# Patient Record
Sex: Female | Born: 1945 | Hispanic: No | Marital: Single | Smoking: Never smoker
Health system: Southern US, Community
[De-identification: ages and names within clinical notes are randomized; demographics above are authoritative.]

## PROBLEM LIST (undated history)

## (undated) DIAGNOSIS — F32A Depression, unspecified: Secondary | ICD-10-CM

## (undated) DIAGNOSIS — M359 Systemic involvement of connective tissue, unspecified: Secondary | ICD-10-CM

## (undated) DIAGNOSIS — I10 Essential (primary) hypertension: Secondary | ICD-10-CM

## (undated) DIAGNOSIS — G250 Essential tremor: Secondary | ICD-10-CM

## (undated) DIAGNOSIS — K219 Gastro-esophageal reflux disease without esophagitis: Secondary | ICD-10-CM

## (undated) DIAGNOSIS — T7840XA Allergy, unspecified, initial encounter: Secondary | ICD-10-CM

## (undated) DIAGNOSIS — F329 Major depressive disorder, single episode, unspecified: Secondary | ICD-10-CM

## (undated) HISTORY — PX: CHOLECYSTECTOMY: SHX55

## (undated) HISTORY — DX: Essential (primary) hypertension: I10

## (undated) HISTORY — DX: Depression, unspecified: F32.A

## (undated) HISTORY — DX: Allergy, unspecified, initial encounter: T78.40XA

## (undated) HISTORY — DX: Essential tremor: G25.0

## (undated) HISTORY — DX: Gastro-esophageal reflux disease without esophagitis: K21.9

## (undated) HISTORY — PX: COSMETIC SURGERY: SHX468

---
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
## 2001-07-28 ENCOUNTER — Other Ambulatory Visit: Admission: RE | Admit: 2001-07-28 | Discharge: 2001-07-28 | Payer: Self-pay | Admitting: Dermatology

## 2004-10-19 ENCOUNTER — Ambulatory Visit: Payer: Self-pay | Admitting: Family Medicine

## 2004-12-01 ENCOUNTER — Ambulatory Visit: Payer: Self-pay | Admitting: Family Medicine

## 2004-12-20 ENCOUNTER — Ambulatory Visit: Payer: Self-pay | Admitting: Family Medicine

## 2005-01-22 ENCOUNTER — Ambulatory Visit: Payer: Self-pay | Admitting: Family Medicine

## 2005-02-28 ENCOUNTER — Ambulatory Visit: Payer: Self-pay | Admitting: Family Medicine

## 2005-04-23 ENCOUNTER — Ambulatory Visit: Payer: Self-pay | Admitting: Family Medicine

## 2005-07-27 ENCOUNTER — Ambulatory Visit: Payer: Self-pay | Admitting: Family Medicine

## 2005-08-15 ENCOUNTER — Ambulatory Visit: Payer: Self-pay | Admitting: Family Medicine

## 2005-09-28 ENCOUNTER — Ambulatory Visit: Payer: Self-pay | Admitting: Family Medicine

## 2005-11-27 ENCOUNTER — Ambulatory Visit: Payer: Self-pay | Admitting: Family Medicine

## 2006-03-28 ENCOUNTER — Ambulatory Visit: Payer: Self-pay | Admitting: Family Medicine

## 2006-05-16 ENCOUNTER — Ambulatory Visit: Payer: Self-pay | Admitting: Family Medicine

## 2006-06-11 ENCOUNTER — Ambulatory Visit: Payer: Self-pay | Admitting: Family Medicine

## 2006-07-29 ENCOUNTER — Ambulatory Visit: Payer: Self-pay | Admitting: Family Medicine

## 2006-08-05 ENCOUNTER — Ambulatory Visit: Payer: Self-pay | Admitting: Family Medicine

## 2006-10-09 ENCOUNTER — Ambulatory Visit: Payer: Self-pay | Admitting: Family Medicine

## 2007-01-31 ENCOUNTER — Ambulatory Visit: Payer: Self-pay | Admitting: Family Medicine

## 2007-03-24 ENCOUNTER — Ambulatory Visit: Payer: Self-pay | Admitting: Family Medicine

## 2014-06-08 DIAGNOSIS — T7840XA Allergy, unspecified, initial encounter: Secondary | ICD-10-CM | POA: Insufficient documentation

## 2014-06-08 DIAGNOSIS — F411 Generalized anxiety disorder: Secondary | ICD-10-CM | POA: Insufficient documentation

## 2018-05-10 DEATH — deceased

## 2018-08-18 DIAGNOSIS — M1990 Unspecified osteoarthritis, unspecified site: Secondary | ICD-10-CM | POA: Insufficient documentation

## 2018-08-18 DIAGNOSIS — F3342 Major depressive disorder, recurrent, in full remission: Secondary | ICD-10-CM | POA: Insufficient documentation

## 2019-09-07 ENCOUNTER — Other Ambulatory Visit: Payer: Self-pay

## 2019-09-07 ENCOUNTER — Encounter: Payer: Self-pay | Admitting: Family Medicine

## 2019-09-07 ENCOUNTER — Ambulatory Visit (INDEPENDENT_AMBULATORY_CARE_PROVIDER_SITE_OTHER): Payer: Medicare Other | Admitting: Family Medicine

## 2019-09-07 VITALS — BP 118/66 | HR 88 | Temp 98.6°F | Resp 18 | Ht 61.0 in | Wt 148.0 lb

## 2019-09-07 DIAGNOSIS — K2100 Gastro-esophageal reflux disease with esophagitis, without bleeding: Secondary | ICD-10-CM

## 2019-09-07 DIAGNOSIS — F331 Major depressive disorder, recurrent, moderate: Secondary | ICD-10-CM

## 2019-09-07 DIAGNOSIS — I10 Essential (primary) hypertension: Secondary | ICD-10-CM

## 2019-09-07 DIAGNOSIS — Z23 Encounter for immunization: Secondary | ICD-10-CM | POA: Diagnosis not present

## 2019-09-07 MED ORDER — LOSARTAN POTASSIUM-HCTZ 100-25 MG PO TABS: 1.0000 | ORAL_TABLET | Freq: Every day | ORAL | 1 refills | Status: DC

## 2019-09-07 MED ORDER — VENLAFAXINE HCL ER 75 MG PO CP24: 75.0000 mg | ORAL_CAPSULE | Freq: Every day | ORAL | 1 refills | Status: DC

## 2019-09-07 MED ORDER — FLUTICASONE PROPIONATE 50 MCG/ACT NA SUSP: 1.0000 | Freq: Every day | NASAL | 3 refills | Status: DC

## 2019-09-07 MED ORDER — CETIRIZINE HCL 10 MG PO TABS: 10.0000 mg | ORAL_TABLET | Freq: Every day | ORAL | 3 refills | Status: DC

## 2019-09-07 MED ORDER — MELOXICAM 7.5 MG PO TABS: 7.5000 mg | ORAL_TABLET | Freq: Two times a day (BID) | ORAL | 1 refills | Status: DC

## 2019-09-07 MED ORDER — MONTELUKAST SODIUM 10 MG PO TABS: 10.0000 mg | ORAL_TABLET | Freq: Every day | ORAL | 1 refills | Status: DC

## 2019-09-07 MED ORDER — PANTOPRAZOLE SODIUM 40 MG PO TBEC: 40.0000 mg | DELAYED_RELEASE_TABLET | Freq: Every day | ORAL | 11 refills | Status: DC

## 2019-09-07 MED ORDER — LORAZEPAM 0.5 MG PO TABS: 0.5000 mg | ORAL_TABLET | Freq: Every day | ORAL | 1 refills | Status: DC

## 2019-09-07 NOTE — Progress Notes (Signed)
Subjective:  Patient ID: Lauren Stokes, female    DOB: 05/01/1946  Age: 73 y.o. MRN: [id number]  CC: Establish Care (NEW)   HPI Lauren Stokes presents for new patient appointment.  She is to be followed due to the retirement of Dr. Edrick Oh.  She has been followed there for hypertension, depression and gastroesophageal reflux.   Follow-up of hypertension. Patient has no history of headache chest pain or shortness of breath or recent cough. Patient also denies symptoms of TIA such as numbness weakness lateralizing. Patient checks  blood pressure at home and has not had any elevated readings recently. Patient denies side effects from his medication. States taking it regularly. Patient in for follow-up of GERD. Currently asymptomatic taking  PPI daily. There is no chest pain or heartburn. No hematemesis and no melena. No dysphagia or choking. Onset is remote. Progression is stable. Complicating factors, none.   Depression screen PHQ 2/9 09/07/2019  Decreased Interest 0  Down, Depressed, Hopeless 0  PHQ - 2 Score 0    History Lauren Stokes has a past medical history of Allergy, Depression, Essential tremor, GERD (gastroesophageal reflux disease), and Hypertension.   She has a past surgical history that includes Cosmetic surgery and Cholecystectomy.   Her family history includes Aneurysm in her mother; Anxiety disorder in her mother; Cancer in her father; Hearing loss in her sister; Pneumonia in her mother; Ulcers in her mother.She reports that she has never smoked. She has never used smokeless tobacco. She reports that she does not drink alcohol or use drugs.    ROS Review of Systems  Constitutional: Negative.   HENT: Negative for congestion.   Eyes: Negative for visual disturbance.  Respiratory: Negative for shortness of breath.   Cardiovascular: Negative for chest pain.  Gastrointestinal: Negative for abdominal pain, constipation, diarrhea, nausea and vomiting.  Genitourinary: Negative  for difficulty urinating.  Musculoskeletal: Negative for arthralgias and myalgias.  Neurological: Negative for headaches.  Psychiatric/Behavioral: Negative for sleep disturbance.    Objective:  BP 118/66   Pulse 88   Temp 98.6 F (37 C)   Resp 18   Ht _0  (1.549 m)   Wt 148 lb (67.1 kg)   SpO2 98%   BMI 27.96 kg/m   BP Readings from Last 3 Encounters:  09/07/19 118/66    Wt Readings from Last 3 Encounters:  09/07/19 148 lb (67.1 kg)     Physical Exam Constitutional:      General: She is not in acute distress.    Appearance: She is well-developed.  HENT:     Head: Normocephalic and atraumatic.     Right Ear: External ear normal.     Left Ear: External ear normal.     Nose: Nose normal.  Eyes:     Conjunctiva/sclera: Conjunctivae normal.     Pupils: Pupils are equal, round, and reactive to light.  Neck:     Musculoskeletal: Normal range of motion and neck supple.     Thyroid: No thyromegaly.  Cardiovascular:     Rate and Rhythm: Normal rate and regular rhythm.     Heart sounds: Normal heart sounds. No murmur.  Pulmonary:     Effort: Pulmonary effort is normal. No respiratory distress.     Breath sounds: Normal breath sounds. No wheezing or rales.  Abdominal:     General: Bowel sounds are normal. There is no distension.     Palpations: Abdomen is soft.     Tenderness: There is no abdominal tenderness.  Lymphadenopathy:     Cervical: No cervical adenopathy.  Skin:    General: Skin is warm and dry.  Neurological:     Mental Status: She is alert and oriented to person, place, and time.     Deep Tendon Reflexes: Reflexes are normal and symmetric.  Psychiatric:        Behavior: Behavior normal.        Thought Content: Thought content normal.        Judgment: Judgment normal.       Assessment & Plan:   Lili was seen today for establish care.  Diagnoses and all orders for this visit:  Essential hypertension -     CBC with Differential/Platelet -      CMP14+EGFR -     Lipid panel -     Urinalysis  Need for immunization against influenza -     Flu Vaccine QUAD High Dose(Fluad)  Moderate episode of recurrent major depressive disorder (HCC)  GERD with esophagitis  Other orders -     cetirizine (ZYRTEC) 10 MG tablet; Take 1 tablet (10 mg total) by mouth daily. -     fluticasone (FLONASE) 50 MCG/ACT nasal spray; Place 1 spray into both nostrils daily. -     LORazepam (ATIVAN) 0.5 MG tablet; Take 1 tablet (0.5 mg total) by mouth at bedtime. -     losartan-hydrochlorothiazide (HYZAAR) 100-25 MG tablet; Take 1 tablet by mouth daily. -     meloxicam (MOBIC) 7.5 MG tablet; Take 1 tablet (7.5 mg total) by mouth 2 (two) times daily. -     montelukast (SINGULAIR) 10 MG tablet; Take 1 tablet (10 mg total) by mouth daily. -     venlafaxine XR (EFFEXOR-XR) 75 MG 24 hr capsule; Take 1 capsule (75 mg total) by mouth daily. -     pantoprazole (PROTONIX) 40 MG tablet; Take 1 tablet (40 mg total) by mouth daily. For stomach       I have changed Zilpha D. Gadberry's cetirizine, LORazepam, meloxicam, montelukast, and venlafaxine XR. I am also having her start on pantoprazole. Additionally, I am having her maintain her Omeprazole-Sodium Bicarbonate (ZEGERID OTC PO), cholecalciferol, calcium citrate-vitamin D, Cyanocobalamin (VITAMIN B 12 PO), acetaminophen, fluticasone, and losartan-hydrochlorothiazide.  Allergies as of 09/07/2019      Reactions   Cefaclor Nausea And Vomiting   Penicillins Rash   Tetracyclines & Related Rash   vomiting      Medication List       Accurate as of September 07, 2019  6:37 PM. If you have any questions, ask your nurse or doctor.        acetaminophen 650 MG CR tablet Commonly known as: TYLENOL Take 650 mg by mouth every 8 (eight) hours as needed for pain.   calcium citrate-vitamin D 315-200 MG-UNIT tablet Commonly known as: CITRACAL+D Take 1 tablet by mouth daily.   cetirizine 10 MG tablet Commonly known as:  ZYRTEC Take 1 tablet (10 mg total) by mouth daily.   cholecalciferol 25 MCG (1000 UT) tablet Commonly known as: VITAMIN D3 Take 1,000 Units by mouth daily.   fluticasone 50 MCG/ACT nasal spray Commonly known as: FLONASE Place 1 spray into both nostrils daily.   LORazepam 0.5 MG tablet Commonly known as: ATIVAN Take 1 tablet (0.5 mg total) by mouth at bedtime.   losartan-hydrochlorothiazide 100-25 MG tablet Commonly known as: HYZAAR Take 1 tablet by mouth daily.   meloxicam 7.5 MG tablet Commonly known as: MOBIC Take 1 tablet (7.5 mg total)  by mouth 2 (two) times daily. What changed: when to take this Changed by: Claretta Fraise, MD   montelukast 10 MG tablet Commonly known as: SINGULAIR Take 1 tablet (10 mg total) by mouth daily.   pantoprazole 40 MG tablet Commonly known as: PROTONIX Take 1 tablet (40 mg total) by mouth daily. For stomach Started by: Claretta Fraise, MD   venlafaxine XR 75 MG 24 hr capsule Commonly known as: EFFEXOR-XR Take 1 capsule (75 mg total) by mouth daily.   VITAMIN B 12 PO Take 1 tablet by mouth daily.   ZEGERID OTC PO Take 1 tablet by mouth daily.        Follow-up: No follow-ups on file.  Claretta Fraise, M.D.

## 2019-09-10 ENCOUNTER — Ambulatory Visit: Payer: Medicare Other

## 2019-09-16 ENCOUNTER — Ambulatory Visit: Payer: Medicare Other

## 2019-09-16 ENCOUNTER — Other Ambulatory Visit: Payer: Self-pay

## 2020-01-06 ENCOUNTER — Other Ambulatory Visit: Payer: Self-pay

## 2020-01-06 ENCOUNTER — Other Ambulatory Visit: Payer: Self-pay | Admitting: Family Medicine

## 2020-01-06 ENCOUNTER — Other Ambulatory Visit: Payer: Medicare Other

## 2020-01-06 DIAGNOSIS — K2100 Gastro-esophageal reflux disease with esophagitis, without bleeding: Secondary | ICD-10-CM

## 2020-01-06 DIAGNOSIS — I10 Essential (primary) hypertension: Secondary | ICD-10-CM

## 2020-01-06 DIAGNOSIS — E538 Deficiency of other specified B group vitamins: Secondary | ICD-10-CM

## 2020-01-06 DIAGNOSIS — E559 Vitamin D deficiency, unspecified: Secondary | ICD-10-CM

## 2020-01-07 LAB — CBC WITH DIFFERENTIAL/PLATELET
Basophils Absolute: 0 10*3/uL (ref 0.0–0.2)
Basos: 0 %
EOS (ABSOLUTE): 0.2 10*3/uL (ref 0.0–0.4)
Eos: 2 %
Hematocrit: 40.5 % (ref 34.0–46.6)
Hemoglobin: 14.1 g/dL (ref 11.1–15.9)
Immature Grans (Abs): 0 10*3/uL (ref 0.0–0.1)
Immature Granulocytes: 0 %
Lymphocytes Absolute: 2.1 10*3/uL (ref 0.7–3.1)
Lymphs: 29 %
MCH: 31.8 pg (ref 26.6–33.0)
MCHC: 34.8 g/dL (ref 31.5–35.7)
MCV: 91 fL (ref 79–97)
Monocytes Absolute: 0.7 10*3/uL (ref 0.1–0.9)
Monocytes: 10 %
Neutrophils Absolute: 4.2 10*3/uL (ref 1.4–7.0)
Neutrophils: 59 %
Platelets: 308 10*3/uL (ref 150–450)
RBC: 4.43 x10E6/uL (ref 3.77–5.28)
RDW: 12 % (ref 11.7–15.4)
WBC: 7.2 10*3/uL (ref 3.4–10.8)

## 2020-01-07 LAB — CMP14+EGFR
ALT: 30 IU/L (ref 0–32)
AST: 29 IU/L (ref 0–40)
Albumin/Globulin Ratio: 1.5 (ref 1.2–2.2)
Albumin: 4.3 g/dL (ref 3.7–4.7)
Alkaline Phosphatase: 74 IU/L (ref 39–117)
BUN/Creatinine Ratio: 21 (ref 12–28)
BUN: 16 mg/dL (ref 8–27)
Bilirubin Total: 0.3 mg/dL (ref 0.0–1.2)
CO2: 23 mmol/L (ref 20–29)
Calcium: 9.4 mg/dL (ref 8.7–10.3)
Chloride: 100 mmol/L (ref 96–106)
Creatinine, Ser: 0.75 mg/dL (ref 0.57–1.00)
GFR calc Af Amer: 92 mL/min/{1.73_m2} (ref >59)
GFR calc non Af Amer: 80 mL/min/{1.73_m2} (ref >59)
Globulin, Total: 2.9 g/dL (ref 1.5–4.5)
Glucose: 99 mg/dL (ref 65–99)
Potassium: 4.1 mmol/L (ref 3.5–5.2)
Sodium: 142 mmol/L (ref 134–144)
Total Protein: 7.2 g/dL (ref 6.0–8.5)

## 2020-01-07 LAB — TSH: TSH: 1.87 u[IU]/mL (ref 0.450–4.500)

## 2020-01-07 LAB — LIPID PANEL
Chol/HDL Ratio: 2 ratio (ref 0.0–4.4)
Cholesterol, Total: 134 mg/dL (ref 100–199)
HDL: 68 mg/dL (ref >39)
LDL Chol Calc (NIH): 50 mg/dL (ref 0–99)
Triglycerides: 84 mg/dL (ref 0–149)
VLDL Cholesterol Cal: 16 mg/dL (ref 5–40)

## 2020-01-07 LAB — VITAMIN B12: Vitamin B-12: 883 pg/mL (ref 232–1245)

## 2020-01-07 LAB — FOLATE: Folate: >20 ng/mL (ref >3.0)

## 2020-01-07 LAB — VITAMIN D 25 HYDROXY (VIT D DEFICIENCY, FRACTURES): Vit D, 25-Hydroxy: 37.6 ng/mL (ref 30.0–100.0)

## 2020-01-11 ENCOUNTER — Ambulatory Visit: Payer: Medicare Other | Admitting: Family Medicine

## 2020-01-12 ENCOUNTER — Other Ambulatory Visit: Payer: Self-pay

## 2020-01-13 ENCOUNTER — Encounter: Payer: Self-pay | Admitting: Family Medicine

## 2020-01-13 ENCOUNTER — Ambulatory Visit (INDEPENDENT_AMBULATORY_CARE_PROVIDER_SITE_OTHER): Payer: Medicare Other | Admitting: Family Medicine

## 2020-01-13 VITALS — BP 130/70 | HR 81 | Temp 96.7°F | Ht 61.0 in | Wt 148.6 lb

## 2020-01-13 DIAGNOSIS — Z13820 Encounter for screening for osteoporosis: Secondary | ICD-10-CM | POA: Diagnosis not present

## 2020-01-13 DIAGNOSIS — Z1159 Encounter for screening for other viral diseases: Secondary | ICD-10-CM

## 2020-01-13 DIAGNOSIS — J3089 Other allergic rhinitis: Secondary | ICD-10-CM

## 2020-01-13 DIAGNOSIS — Z780 Asymptomatic menopausal state: Secondary | ICD-10-CM

## 2020-01-13 DIAGNOSIS — K2100 Gastro-esophageal reflux disease with esophagitis, without bleeding: Secondary | ICD-10-CM

## 2020-01-13 DIAGNOSIS — E559 Vitamin D deficiency, unspecified: Secondary | ICD-10-CM

## 2020-01-13 DIAGNOSIS — I10 Essential (primary) hypertension: Secondary | ICD-10-CM

## 2020-01-13 DIAGNOSIS — F411 Generalized anxiety disorder: Secondary | ICD-10-CM

## 2020-01-13 MED ORDER — MONTELUKAST SODIUM 10 MG PO TABS: 10.0000 mg | ORAL_TABLET | Freq: Every day | ORAL | 1 refills | Status: DC

## 2020-01-13 MED ORDER — VENLAFAXINE HCL ER 75 MG PO CP24: 75.0000 mg | ORAL_CAPSULE | Freq: Every day | ORAL | 1 refills | Status: DC

## 2020-01-13 MED ORDER — CETIRIZINE HCL 10 MG PO TABS: 10.0000 mg | ORAL_TABLET | Freq: Every day | ORAL | 3 refills | Status: DC

## 2020-01-13 MED ORDER — FLUTICASONE PROPIONATE 50 MCG/ACT NA SUSP: 1.0000 | Freq: Every day | NASAL | 3 refills | Status: DC

## 2020-01-13 MED ORDER — LOSARTAN POTASSIUM-HCTZ 100-25 MG PO TABS: 1.0000 | ORAL_TABLET | Freq: Every day | ORAL | 1 refills | Status: DC

## 2020-01-13 MED ORDER — PANTOPRAZOLE SODIUM 40 MG PO TBEC: 40.0000 mg | DELAYED_RELEASE_TABLET | Freq: Every day | ORAL | 11 refills | Status: DC

## 2020-01-13 MED ORDER — VITAMIN D 25 MCG (1000 UNIT) PO TABS: 1000.0000 [IU] | ORAL_TABLET | Freq: Every day | ORAL | 3 refills | Status: DC

## 2020-01-13 MED ORDER — LORAZEPAM 0.5 MG PO TABS: 0.5000 mg | ORAL_TABLET | Freq: Every day | ORAL | 1 refills | Status: DC

## 2020-01-13 MED ORDER — MELOXICAM 7.5 MG PO TABS: 7.5000 mg | ORAL_TABLET | Freq: Two times a day (BID) | ORAL | 1 refills | Status: DC

## 2020-01-13 NOTE — Progress Notes (Signed)
Subjective:  Patient ID: Lauren Stokes, female    DOB: 1946/06/20  Age: 74 y.o. MRN: [id number]  CC: Follow-up (4 month)   HPI Lauren Stokes presents for  follow-up of hypertension. Patient has no history of headache chest pain or shortness of breath or recent cough. Patient also denies symptoms of TIA such as focal numbness or weakness. Patient denies side effects from medication. States taking it regularly.  Patient in for follow-up of GERD. Currently asymptomatic taking  PPI daily. There is no chest pain or heartburn. No hematemesis and no melena. No dysphagia or choking. Onset is remote. Progression is stable. Complicating factors, none.  Patient has allergic rhinitis symptoms including sneezing frequently sniffling, clear rhinorrhea, watery and itchy eyes. There has been no fever no chills no sweats. Right ear feels funny. . There is some nasal congestion.    History Lauren Stokes has a past medical history of Allergy, Depression, Essential tremor, GERD (gastroesophageal reflux disease), and Hypertension.   She has a past surgical history that includes Cosmetic surgery and Cholecystectomy.   Her family history includes Aneurysm in her mother; Anxiety disorder in her mother; Cancer in her father; Hearing loss in her sister; Pneumonia in her mother; Ulcers in her mother.She reports that she has never smoked. She has never used smokeless tobacco. She reports that she does not drink alcohol or use drugs.  Current Outpatient Medications on File Prior to Visit  Medication Sig Dispense Refill  . acetaminophen (TYLENOL) 650 MG CR tablet Take 650 mg by mouth every 8 (eight) hours as needed for pain.    . calcium citrate-vitamin D (CITRACAL+D) 315-200 MG-UNIT tablet Take 1 tablet by mouth daily.    . Cyanocobalamin (VITAMIN B 12 PO) Take 1 tablet by mouth daily.     No current facility-administered medications on file prior to visit.    ROS Review of Systems  Constitutional: Negative.     HENT: Positive for ear pain. Drooling: feels funny on right.   Eyes: Negative for visual disturbance.  Respiratory: Positive for choking. Negative for shortness of breath.   Cardiovascular: Negative for chest pain.  Gastrointestinal: Negative for abdominal pain.  Musculoskeletal: Negative for arthralgias.    Objective:  BP 130/70   Pulse 81   Temp (!) 96.7 F (35.9 C) (Temporal)   Ht 5\' 1"  (1.549 m)   Wt 148 lb 9.6 oz (67.4 kg)   BMI 28.08 kg/m   BP Readings from Last 3 Encounters:  01/13/20 130/70  09/07/19 118/66    Wt Readings from Last 3 Encounters:  01/13/20 148 lb 9.6 oz (67.4 kg)  09/07/19 148 lb (67.1 kg)     Physical Exam    Assessment & Plan:   Lauren Stokes was seen today for follow-up.  Diagnoses and all orders for this visit:  Essential hypertension -     losartan-hydrochlorothiazide (HYZAAR) 100-25 MG tablet; Take 1 tablet by mouth daily.  Need for hepatitis C screening test -     Hepatitis C antibody  Screening for osteoporosis -     DG Bone Density; Future  Post-menopause -     DG Bone Density; Future -     MM 3D SCREEN BREAST BILATERAL; Future  Gastroesophageal reflux disease with esophagitis, unspecified whether hemorrhage -     pantoprazole (PROTONIX) 40 MG tablet; Take 1 tablet (40 mg total) by mouth daily. For stomach  Non-seasonal allergic rhinitis, unspecified trigger -     cetirizine (ZYRTEC) 10 MG tablet; Take 1 tablet (  10 mg total) by mouth daily. -     fluticasone (FLONASE) 50 MCG/ACT nasal spray; Place 1 spray into both nostrils daily. -     montelukast (SINGULAIR) 10 MG tablet; Take 1 tablet (10 mg total) by mouth daily.  GAD (generalized anxiety disorder) -     LORazepam (ATIVAN) 0.5 MG tablet; Take 1 tablet (0.5 mg total) by mouth at bedtime. -     venlafaxine XR (EFFEXOR-XR) 75 MG 24 hr capsule; Take 1 capsule (75 mg total) by mouth daily.  Vitamin D deficiency -     cholecalciferol (VITAMIN D3) 25 MCG (1000 UNIT) tablet;  Take 1 tablet (1,000 Units total) by mouth daily.  Other orders -     meloxicam (MOBIC) 7.5 MG tablet; Take 1 tablet (7.5 mg total) by mouth 2 (two) times daily.   Allergies as of 01/13/2020      Reactions   Cefaclor Nausea And Vomiting   Penicillins Rash   Tetracyclines & Related Rash   vomiting      Medication List       Accurate as of January 13, 2020  6:44 PM. If you have any questions, ask your nurse or doctor.        STOP taking these medications   ZEGERID OTC PO Stopped by: Mechele Claude, MD     TAKE these medications   acetaminophen 650 MG CR tablet Commonly known as: TYLENOL Take 650 mg by mouth every 8 (eight) hours as needed for pain.   calcium citrate-vitamin D 315-200 MG-UNIT tablet Commonly known as: CITRACAL+D Take 1 tablet by mouth daily.   cetirizine 10 MG tablet Commonly known as: ZYRTEC Take 1 tablet (10 mg total) by mouth daily.   cholecalciferol 25 MCG (1000 UNIT) tablet Commonly known as: VITAMIN D3 Take 1 tablet (1,000 Units total) by mouth daily.   fluticasone 50 MCG/ACT nasal spray Commonly known as: FLONASE Place 1 spray into both nostrils daily.   LORazepam 0.5 MG tablet Commonly known as: ATIVAN Take 1 tablet (0.5 mg total) by mouth at bedtime.   losartan-hydrochlorothiazide 100-25 MG tablet Commonly known as: HYZAAR Take 1 tablet by mouth daily.   meloxicam 7.5 MG tablet Commonly known as: MOBIC Take 1 tablet (7.5 mg total) by mouth 2 (two) times daily.   montelukast 10 MG tablet Commonly known as: SINGULAIR Take 1 tablet (10 mg total) by mouth daily.   pantoprazole 40 MG tablet Commonly known as: PROTONIX Take 1 tablet (40 mg total) by mouth daily. For stomach   venlafaxine XR 75 MG 24 hr capsule Commonly known as: EFFEXOR-XR Take 1 capsule (75 mg total) by mouth daily.   VITAMIN B 12 PO Take 1 tablet by mouth daily.       Meds ordered this encounter  Medications  . cetirizine (ZYRTEC) 10 MG tablet    Sig:  Take 1 tablet (10 mg total) by mouth daily.    Dispense:  90 tablet    Refill:  3  . cholecalciferol (VITAMIN D3) 25 MCG (1000 UNIT) tablet    Sig: Take 1 tablet (1,000 Units total) by mouth daily.    Dispense:  90 tablet    Refill:  3  . fluticasone (FLONASE) 50 MCG/ACT nasal spray    Sig: Place 1 spray into both nostrils daily.    Dispense:  18.2 mL    Refill:  3  . LORazepam (ATIVAN) 0.5 MG tablet    Sig: Take 1 tablet (0.5 mg total) by mouth at  bedtime.    Dispense:  90 tablet    Refill:  1  . losartan-hydrochlorothiazide (HYZAAR) 100-25 MG tablet    Sig: Take 1 tablet by mouth daily.    Dispense:  90 tablet    Refill:  1  . meloxicam (MOBIC) 7.5 MG tablet    Sig: Take 1 tablet (7.5 mg total) by mouth 2 (two) times daily.    Dispense:  180 tablet    Refill:  1  . montelukast (SINGULAIR) 10 MG tablet    Sig: Take 1 tablet (10 mg total) by mouth daily.    Dispense:  90 tablet    Refill:  1  . venlafaxine XR (EFFEXOR-XR) 75 MG 24 hr capsule    Sig: Take 1 capsule (75 mg total) by mouth daily.    Dispense:  90 capsule    Refill:  1  . pantoprazole (PROTONIX) 40 MG tablet    Sig: Take 1 tablet (40 mg total) by mouth daily. For stomach    Dispense:  30 tablet    Refill:  11      Follow-up: No follow-ups on file.  Claretta Fraise, M.D.

## 2020-01-14 LAB — HEPATITIS C ANTIBODY: Hep C Virus Ab: <0.1 s/co ratio (ref 0.0–0.9)

## 2020-04-06 ENCOUNTER — Other Ambulatory Visit: Payer: Self-pay | Admitting: Family Medicine

## 2020-04-06 DIAGNOSIS — Z780 Asymptomatic menopausal state: Secondary | ICD-10-CM

## 2020-05-12 ENCOUNTER — Other Ambulatory Visit: Payer: Medicare Other

## 2020-05-12 ENCOUNTER — Encounter: Payer: Self-pay | Admitting: Family Medicine

## 2020-05-12 ENCOUNTER — Other Ambulatory Visit: Payer: Self-pay

## 2020-05-12 ENCOUNTER — Ambulatory Visit (INDEPENDENT_AMBULATORY_CARE_PROVIDER_SITE_OTHER): Payer: Medicare Other | Admitting: Family Medicine

## 2020-05-12 VITALS — BP 116/70 | HR 82 | Temp 98.2°F | Ht 61.0 in | Wt 148.4 lb

## 2020-05-12 DIAGNOSIS — F411 Generalized anxiety disorder: Secondary | ICD-10-CM | POA: Insufficient documentation

## 2020-05-12 DIAGNOSIS — J3089 Other allergic rhinitis: Secondary | ICD-10-CM

## 2020-05-12 DIAGNOSIS — K2100 Gastro-esophageal reflux disease with esophagitis, without bleeding: Secondary | ICD-10-CM | POA: Diagnosis not present

## 2020-05-12 DIAGNOSIS — I10 Essential (primary) hypertension: Secondary | ICD-10-CM

## 2020-05-12 DIAGNOSIS — F331 Major depressive disorder, recurrent, moderate: Secondary | ICD-10-CM | POA: Diagnosis not present

## 2020-05-12 MED ORDER — LOSARTAN POTASSIUM-HCTZ 100-25 MG PO TABS: 1.0000 | ORAL_TABLET | Freq: Every day | ORAL | 1 refills | Status: DC

## 2020-05-12 MED ORDER — LORAZEPAM 0.5 MG PO TABS: 0.5000 mg | ORAL_TABLET | Freq: Every day | ORAL | 0 refills | Status: DC

## 2020-05-12 MED ORDER — VENLAFAXINE HCL ER 75 MG PO CP24: 75.0000 mg | ORAL_CAPSULE | Freq: Every day | ORAL | 1 refills | Status: DC

## 2020-05-12 MED ORDER — MONTELUKAST SODIUM 10 MG PO TABS: 10.0000 mg | ORAL_TABLET | Freq: Every day | ORAL | 1 refills | Status: DC

## 2020-05-12 MED ORDER — FLUTICASONE PROPIONATE 50 MCG/ACT NA SUSP: 1.0000 | Freq: Every day | NASAL | 3 refills | Status: DC

## 2020-05-12 MED ORDER — MELOXICAM 7.5 MG PO TABS: 7.5000 mg | ORAL_TABLET | Freq: Two times a day (BID) | ORAL | 1 refills | Status: DC

## 2020-05-12 NOTE — Progress Notes (Signed)
Subjective:  Patient ID: Lauren Stokes, female    DOB: [birth year]  Age: 74 y.o. MRN: [id number]  CC: Follow-up (4 month)   HPI Lauren Stokes presents for  follow-up of hypertension. Patient has no history of headache chest pain or shortness of breath or recent cough. Patient also denies symptoms of TIA such as focal numbness or weakness. Patient denies side effects from medication. States taking it regularly.  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  She is also seeing Dr. Irving Shows for foot pain.  History Lauren Stokes has a past medical history of Allergy, Depression, Essential tremor, GERD (gastroesophageal reflux disease), and Hypertension.   She has a past surgical history that includes Cosmetic surgery and Cholecystectomy.   Her family history includes Aneurysm in her mother; Anxiety disorder in her mother; Cancer in her father; Hearing loss in her sister; Pneumonia in her mother; Ulcers in her mother.She reports that she has never smoked. She has never used smokeless tobacco. She reports that she does not drink alcohol or use drugs.  Current Outpatient Medications on File Prior to Visit  Medication Sig Dispense Refill  . acetaminophen (TYLENOL) 650 MG CR tablet Take 650 mg by mouth every 8 (eight) hours as needed for pain.    . calcium citrate-vitamin D (CITRACAL+D) 315-200 MG-UNIT tablet Take 1 tablet by mouth daily.    . cetirizine (ZYRTEC) 10 MG tablet Take 1 tablet (10 mg total) by mouth daily. 90 tablet 3  . cholecalciferol (VITAMIN D3) 25 MCG (1000 UNIT) tablet Take 1 tablet (1,000 Units total) by mouth daily. 90 tablet 3  . Cyanocobalamin (VITAMIN B 12 PO) Take 1 tablet by mouth daily.     No current facility-administered medications on file prior to visit.    ROS Review of Systems  Constitutional: Negative.   HENT: Negative.   Eyes: Negative for visual disturbance.  Respiratory: Negative for shortness of breath.     Cardiovascular: Negative for chest pain.  Gastrointestinal: Negative for abdominal pain.  Musculoskeletal: Negative for arthralgias.    Objective:  BP 116/70   Pulse 82   Temp 98.2 F (36.8 C) (Temporal)   Ht 5' 1"  (1.549 m)   Wt 148 lb 6.4 oz (67.3 kg)   BMI 28.04 kg/m   BP Readings from Last 3 Encounters:  05/12/20 116/70  01/13/20 130/70  09/07/19 118/66    Wt Readings from Last 3 Encounters:  05/12/20 148 lb 6.4 oz (67.3 kg)  01/13/20 148 lb 9.6 oz (67.4 kg)  09/07/19 148 lb (67.1 kg)     Physical Exam Constitutional:      General: She is not in acute distress.    Appearance: She is well-developed.  HENT:     Head: Normocephalic and atraumatic.  Eyes:     Conjunctiva/sclera: Conjunctivae normal.     Pupils: Pupils are equal, round, and reactive to light.  Neck:     Thyroid: No thyromegaly.  Cardiovascular:     Rate and Rhythm: Normal rate and regular rhythm.     Heart sounds: Normal heart sounds. No murmur.  Pulmonary:     Effort: Pulmonary effort is normal. No respiratory distress.     Breath sounds: Normal breath sounds. No wheezing or rales.  Abdominal:     General: Bowel sounds are normal. There is no distension.     Palpations: Abdomen is soft.     Tenderness: There is no abdominal tenderness.  Musculoskeletal:        General: Normal range of motion.     Cervical back: Normal range of motion and neck supple.  Lymphadenopathy:     Cervical: No cervical adenopathy.  Skin:    General: Skin is warm and dry.  Neurological:     Mental Status: She is alert and oriented to person, place, and time.  Psychiatric:        Behavior: Behavior normal.        Thought Content: Thought content normal.        Judgment: Judgment normal.       Assessment & Plan:   Lauren Stokes was seen today for follow-up.  Diagnoses and all orders for this visit:  Essential hypertension -     CBC with Differential/Platelet -     CMP14+EGFR -     Lipid panel -      losartan-hydrochlorothiazide (HYZAAR) 100-25 MG tablet; Take 1 tablet by mouth daily.  Gastroesophageal reflux disease with esophagitis, unspecified whether hemorrhage -     CBC with Differential/Platelet -     CMP14+EGFR  GAD (generalized anxiety disorder) -     CBC with Differential/Platelet -     CMP14+EGFR -     venlafaxine XR (EFFEXOR-XR) 75 MG 24 hr capsule; Take 1 capsule (75 mg total) by mouth daily. -     LORazepam (ATIVAN) 0.5 MG tablet; Take 1 tablet (0.5 mg total) by mouth at bedtime.  Moderate episode of recurrent major depressive disorder (HCC) -     CBC with Differential/Platelet -     CMP14+EGFR  Non-seasonal allergic rhinitis, unspecified trigger -     montelukast (SINGULAIR) 10 MG tablet; Take 1 tablet (10 mg total) by mouth daily. -     fluticasone (FLONASE) 50 MCG/ACT nasal spray; Place 1 spray into both nostrils daily.  Other orders -     meloxicam (MOBIC) 7.5 MG tablet; Take 1 tablet (7.5 mg total) by mouth 2 (two) times daily. -     omeprazole-sodium bicarbonate (ZEGERID) 40-1100 MG capsule; Take 1 capsule by mouth daily before breakfast.   Allergies as of 05/12/2020      Reactions   Cefaclor Nausea And Vomiting   Penicillins Rash   Tetracyclines & Related Rash   vomiting      Medication List       Accurate as of May 12, 2020 11:59 PM. If you have any questions, ask your nurse or doctor.        STOP taking these medications   pantoprazole 40 MG tablet Commonly known as: PROTONIX Stopped by: Claretta Fraise, MD     TAKE these medications   acetaminophen 650 MG CR tablet Commonly known as: TYLENOL Take 650 mg by mouth every 8 (eight) hours as needed for pain.   calcium citrate-vitamin D 315-200 MG-UNIT tablet Commonly known as: CITRACAL+D Take 1 tablet by mouth daily.   cetirizine 10 MG tablet Commonly known as: ZYRTEC Take 1 tablet (10 mg total) by mouth daily.   cholecalciferol 25 MCG (1000 UNIT) tablet Commonly known as: VITAMIN D3 Take  1 tablet (1,000 Units total) by mouth daily.   fluticasone 50 MCG/ACT nasal spray Commonly known as: FLONASE Place 1 spray into both nostrils daily.   LORazepam 0.5 MG tablet Commonly known as: ATIVAN Take 1 tablet (0.5 mg total) by mouth at bedtime.   losartan-hydrochlorothiazide 100-25 MG tablet Commonly known as: HYZAAR Take 1 tablet by mouth daily.   meloxicam 7.5 MG tablet Commonly known  as: MOBIC Take 1 tablet (7.5 mg total) by mouth 2 (two) times daily.   montelukast 10 MG tablet Commonly known as: SINGULAIR Take 1 tablet (10 mg total) by mouth daily.   omeprazole-sodium bicarbonate 40-1100 MG capsule Commonly known as: Zegerid Take 1 capsule by mouth daily before breakfast. Started by: Claretta Fraise, MD   venlafaxine XR 75 MG 24 hr capsule Commonly known as: EFFEXOR-XR Take 1 capsule (75 mg total) by mouth daily.   VITAMIN B 12 PO Take 1 tablet by mouth daily.       Meds ordered this encounter  Medications  . venlafaxine XR (EFFEXOR-XR) 75 MG 24 hr capsule    Sig: Take 1 capsule (75 mg total) by mouth daily.    Dispense:  90 capsule    Refill:  1  . montelukast (SINGULAIR) 10 MG tablet    Sig: Take 1 tablet (10 mg total) by mouth daily.    Dispense:  90 tablet    Refill:  1  . meloxicam (MOBIC) 7.5 MG tablet    Sig: Take 1 tablet (7.5 mg total) by mouth 2 (two) times daily.    Dispense:  180 tablet    Refill:  1  . losartan-hydrochlorothiazide (HYZAAR) 100-25 MG tablet    Sig: Take 1 tablet by mouth daily.    Dispense:  90 tablet    Refill:  1  . LORazepam (ATIVAN) 0.5 MG tablet    Sig: Take 1 tablet (0.5 mg total) by mouth at bedtime.    Dispense:  90 tablet    Refill:  0  . fluticasone (FLONASE) 50 MCG/ACT nasal spray    Sig: Place 1 spray into both nostrils daily.    Dispense:  18.2 mL    Refill:  3  . omeprazole-sodium bicarbonate (ZEGERID) 40-1100 MG capsule    Sig: Take 1 capsule by mouth daily before breakfast.    Dispense:  90 capsule      Refill:  1      Follow-up: Return in about 6 months (around 11/11/2020).  Claretta Fraise, M.D.

## 2020-05-13 LAB — CMP14+EGFR
ALT: 27 IU/L (ref 0–32)
AST: 25 IU/L (ref 0–40)
Albumin/Globulin Ratio: 1.7 (ref 1.2–2.2)
Albumin: 4.4 g/dL (ref 3.7–4.7)
Alkaline Phosphatase: 73 IU/L (ref 48–121)
BUN/Creatinine Ratio: 25 (ref 12–28)
BUN: 21 mg/dL (ref 8–27)
Bilirubin Total: 0.2 mg/dL (ref 0.0–1.2)
CO2: 24 mmol/L (ref 20–29)
Calcium: 9.4 mg/dL (ref 8.7–10.3)
Chloride: 102 mmol/L (ref 96–106)
Creatinine, Ser: 0.84 mg/dL (ref 0.57–1.00)
GFR calc Af Amer: 80 mL/min/{1.73_m2} (ref >59)
GFR calc non Af Amer: 70 mL/min/{1.73_m2} (ref >59)
Globulin, Total: 2.6 g/dL (ref 1.5–4.5)
Glucose: 108 mg/dL — ABNORMAL HIGH (ref 65–99)
Potassium: 4.1 mmol/L (ref 3.5–5.2)
Sodium: 141 mmol/L (ref 134–144)
Total Protein: 7 g/dL (ref 6.0–8.5)

## 2020-05-13 LAB — CBC WITH DIFFERENTIAL/PLATELET
Basophils Absolute: 0.1 10*3/uL (ref 0.0–0.2)
Basos: 1 %
EOS (ABSOLUTE): 0.2 10*3/uL (ref 0.0–0.4)
Eos: 2 %
Hematocrit: 37.6 % (ref 34.0–46.6)
Hemoglobin: 12.9 g/dL (ref 11.1–15.9)
Immature Grans (Abs): 0 10*3/uL (ref 0.0–0.1)
Immature Granulocytes: 0 %
Lymphocytes Absolute: 2.5 10*3/uL (ref 0.7–3.1)
Lymphs: 29 %
MCH: 31.1 pg (ref 26.6–33.0)
MCHC: 34.3 g/dL (ref 31.5–35.7)
MCV: 91 fL (ref 79–97)
Monocytes Absolute: 1.1 10*3/uL — ABNORMAL HIGH (ref 0.1–0.9)
Monocytes: 12 %
Neutrophils Absolute: 4.9 10*3/uL (ref 1.4–7.0)
Neutrophils: 56 %
Platelets: 340 10*3/uL (ref 150–450)
RBC: 4.15 x10E6/uL (ref 3.77–5.28)
RDW: 12.4 % (ref 11.7–15.4)
WBC: 8.8 10*3/uL (ref 3.4–10.8)

## 2020-05-13 LAB — LIPID PANEL
Chol/HDL Ratio: 2.5 ratio (ref 0.0–4.4)
Cholesterol, Total: 146 mg/dL (ref 100–199)
HDL: 59 mg/dL (ref >39)
LDL Chol Calc (NIH): 69 mg/dL (ref 0–99)
Triglycerides: 99 mg/dL (ref 0–149)
VLDL Cholesterol Cal: 18 mg/dL (ref 5–40)

## 2020-05-13 NOTE — Progress Notes (Signed)
Hello Julieth,  Your lab result is normal and/or stable.Some minor variations that are not significant are commonly marked abnormal, but do not represent any medical problem for you.  Best regards, Reice Bienvenue, M.D.

## 2020-05-16 ENCOUNTER — Encounter: Payer: Self-pay | Admitting: Family Medicine

## 2020-05-16 MED ORDER — OMEPRAZOLE-SODIUM BICARBONATE 40-1100 MG PO CAPS: 1.0000 | ORAL_CAPSULE | Freq: Every day | ORAL | 1 refills | Status: DC

## 2020-05-17 ENCOUNTER — Telehealth: Payer: Self-pay | Admitting: *Deleted

## 2020-05-17 NOTE — Telephone Encounter (Signed)
She said it worked better.

## 2020-05-17 NOTE — Telephone Encounter (Signed)
Pt's insurance is requiring PA on the omeprazole-sodium bicarbonate 40-100mg  capsules. Why did we stop the protonix and start this medication. I didn't see anything in the notes.

## 2020-05-17 NOTE — Telephone Encounter (Signed)
Prior Auth for omeprazole-sodium bicarbonate 40-1100mg  capsule- In Process   Key: [id number]   Your information has been submitted to [employer]. [employer] [place] will review the request and notify you of the determination decision directly, typically within 3 business days of your submission and once all necessary information is received.  You will also receive your request decision electronically. To check for an update later, open the request again from your dashboard.  If [employer] [place] has not responded within the specified timeframe or if you have any questions about your PA submission, contact [employer] [place] directly at [facility]) [phone] or (PDP) [phone].

## 2020-05-18 NOTE — Telephone Encounter (Signed)
Omeprazole 40mg -1100mg  has been approved until 05/17/2021 per [employer]. [facility] in [place] made aware.

## 2020-07-13 LAB — HM MAMMOGRAPHY

## 2020-08-24 ENCOUNTER — Other Ambulatory Visit: Payer: Self-pay | Admitting: Family Medicine

## 2020-08-24 DIAGNOSIS — F411 Generalized anxiety disorder: Secondary | ICD-10-CM

## 2020-08-29 ENCOUNTER — Other Ambulatory Visit: Payer: Self-pay | Admitting: Family Medicine

## 2020-09-16 ENCOUNTER — Ambulatory Visit (INDEPENDENT_AMBULATORY_CARE_PROVIDER_SITE_OTHER): Payer: Medicare Other

## 2020-09-16 DIAGNOSIS — Z23 Encounter for immunization: Secondary | ICD-10-CM

## 2020-09-30 ENCOUNTER — Encounter: Payer: Self-pay | Admitting: *Deleted

## 2020-11-14 ENCOUNTER — Ambulatory Visit: Payer: Medicare Other | Admitting: Family Medicine

## 2020-11-14 ENCOUNTER — Other Ambulatory Visit: Payer: Medicare Other

## 2020-11-15 ENCOUNTER — Ambulatory Visit (INDEPENDENT_AMBULATORY_CARE_PROVIDER_SITE_OTHER): Payer: Medicare Other

## 2020-11-15 ENCOUNTER — Encounter: Payer: Self-pay | Admitting: Family Medicine

## 2020-11-15 ENCOUNTER — Other Ambulatory Visit: Payer: Self-pay

## 2020-11-15 ENCOUNTER — Ambulatory Visit (INDEPENDENT_AMBULATORY_CARE_PROVIDER_SITE_OTHER): Payer: Medicare Other | Admitting: Family Medicine

## 2020-11-15 VITALS — Temp 97.2°F | Resp 20 | Ht 61.0 in | Wt 149.2 lb

## 2020-11-15 DIAGNOSIS — R0789 Other chest pain: Secondary | ICD-10-CM

## 2020-11-15 DIAGNOSIS — Z780 Asymptomatic menopausal state: Secondary | ICD-10-CM | POA: Diagnosis not present

## 2020-11-15 DIAGNOSIS — K2100 Gastro-esophageal reflux disease with esophagitis, without bleeding: Secondary | ICD-10-CM | POA: Diagnosis not present

## 2020-11-15 DIAGNOSIS — F411 Generalized anxiety disorder: Secondary | ICD-10-CM

## 2020-11-15 DIAGNOSIS — Z13820 Encounter for screening for osteoporosis: Secondary | ICD-10-CM

## 2020-11-15 DIAGNOSIS — I10 Essential (primary) hypertension: Secondary | ICD-10-CM | POA: Diagnosis not present

## 2020-11-15 DIAGNOSIS — J3089 Other allergic rhinitis: Secondary | ICD-10-CM | POA: Diagnosis not present

## 2020-11-15 DIAGNOSIS — F1320 Sedative, hypnotic or anxiolytic dependence, uncomplicated: Secondary | ICD-10-CM

## 2020-11-15 LAB — LIPID PANEL
Chol/HDL Ratio: 2.2 ratio (ref 0.0–4.4)
Cholesterol, Total: 136 mg/dL (ref 100–199)
HDL: 63 mg/dL (ref >39)
LDL Chol Calc (NIH): 57 mg/dL (ref 0–99)
Triglycerides: 83 mg/dL (ref 0–149)
VLDL Cholesterol Cal: 16 mg/dL (ref 5–40)

## 2020-11-15 LAB — CMP14+EGFR
ALT: 27 IU/L (ref 0–32)
AST: 22 IU/L (ref 0–40)
Albumin/Globulin Ratio: 1.7 (ref 1.2–2.2)
Albumin: 4.3 g/dL (ref 3.7–4.7)
Alkaline Phosphatase: 69 IU/L (ref 44–121)
BUN/Creatinine Ratio: 35 — ABNORMAL HIGH (ref 12–28)
BUN: 24 mg/dL (ref 8–27)
Bilirubin Total: 0.2 mg/dL (ref 0.0–1.2)
CO2: 25 mmol/L (ref 20–29)
Calcium: 9.6 mg/dL (ref 8.7–10.3)
Chloride: 101 mmol/L (ref 96–106)
Creatinine, Ser: 0.68 mg/dL (ref 0.57–1.00)
GFR calc Af Amer: 100 mL/min/{1.73_m2} (ref >59)
GFR calc non Af Amer: 87 mL/min/{1.73_m2} (ref >59)
Globulin, Total: 2.6 g/dL (ref 1.5–4.5)
Glucose: 139 mg/dL — ABNORMAL HIGH (ref 65–99)
Potassium: 3.9 mmol/L (ref 3.5–5.2)
Sodium: 140 mmol/L (ref 134–144)
Total Protein: 6.9 g/dL (ref 6.0–8.5)

## 2020-11-15 LAB — CBC WITH DIFFERENTIAL/PLATELET
Basophils Absolute: 0 10*3/uL (ref 0.0–0.2)
Basos: 1 %
EOS (ABSOLUTE): 0.2 10*3/uL (ref 0.0–0.4)
Eos: 2 %
Hematocrit: 37.1 % (ref 34.0–46.6)
Hemoglobin: 12.6 g/dL (ref 11.1–15.9)
Immature Grans (Abs): 0 10*3/uL (ref 0.0–0.1)
Immature Granulocytes: 0 %
Lymphocytes Absolute: 2.3 10*3/uL (ref 0.7–3.1)
Lymphs: 29 %
MCH: 31.2 pg (ref 26.6–33.0)
MCHC: 34 g/dL (ref 31.5–35.7)
MCV: 92 fL (ref 79–97)
Monocytes Absolute: 0.7 10*3/uL (ref 0.1–0.9)
Monocytes: 9 %
Neutrophils Absolute: 4.7 10*3/uL (ref 1.4–7.0)
Neutrophils: 59 %
Platelets: 309 10*3/uL (ref 150–450)
RBC: 4.04 x10E6/uL (ref 3.77–5.28)
RDW: 12 % (ref 11.7–15.4)
WBC: 7.9 10*3/uL (ref 3.4–10.8)

## 2020-11-15 MED ORDER — LOSARTAN POTASSIUM-HCTZ 100-25 MG PO TABS: 1.0000 | ORAL_TABLET | Freq: Every day | ORAL | 1 refills | Status: DC

## 2020-11-15 MED ORDER — VENLAFAXINE HCL ER 75 MG PO CP24: 75.0000 mg | ORAL_CAPSULE | Freq: Every day | ORAL | 1 refills | Status: DC

## 2020-11-15 MED ORDER — OMEPRAZOLE-SODIUM BICARBONATE 40-1100 MG PO CAPS: ORAL_CAPSULE | ORAL | 1 refills | Status: DC

## 2020-11-15 MED ORDER — LORAZEPAM 0.5 MG PO TABS: 0.5000 mg | ORAL_TABLET | Freq: Every day | ORAL | 0 refills | Status: DC

## 2020-11-15 MED ORDER — MELOXICAM 7.5 MG PO TABS
7.5000 mg | ORAL_TABLET | Freq: Two times a day (BID) | ORAL | 1 refills | Status: DC
Start: 2020-11-15 — End: 2021-05-16

## 2020-11-15 MED ORDER — MONTELUKAST SODIUM 10 MG PO TABS: 10.0000 mg | ORAL_TABLET | Freq: Every day | ORAL | 1 refills | Status: DC

## 2020-11-15 MED ORDER — FLUTICASONE PROPIONATE 50 MCG/ACT NA SUSP: 1.0000 | Freq: Every day | NASAL | 5 refills | Status: DC

## 2020-11-15 MED ORDER — CLOTRIMAZOLE-BETAMETHASONE 1-0.05 % EX CREA: 1.0000 "application " | TOPICAL_CREAM | Freq: Two times a day (BID) | CUTANEOUS | 1 refills | Status: AC

## 2020-11-15 NOTE — Progress Notes (Signed)
Subjective:  Patient ID: Lauren Stokes, female    DOB: 03-27-1946  Age: 74 y.o. MRN: [id number]  CC: Medical Management of Chronic Issues   HPI Lauren Stokes presents for  follow-up of hypertension. Patient has no history of headache  or shortness of breath or recent cough. Patient also denies symptoms of TIA such as focal numbness or weakness. Patient denies side effects from medication. States taking it regularly.  She has been having substernal chest pain intermittently. It is related to exertion but is not radiating. It lasts a few minutes.    GAD 7 : Generalized Anxiety Score 11/15/2020 05/12/2020  Nervous, Anxious, on Edge 0 0  Control/stop worrying 0 0  Worry too much - different things 0 0  Trouble relaxing 0 0  Restless 0 0  Easily annoyed or irritable 0 0  Afraid - awful might happen 0 0  Total GAD 7 Score 0 0  Anxiety Difficulty - Somewhat difficult      History Lauren Stokes has a past medical history of Allergy, Depression, Essential tremor, GERD (gastroesophageal reflux disease), and Hypertension.   She has a past surgical history that includes Cosmetic surgery and Cholecystectomy.   Her family history includes Aneurysm in her mother; Anxiety disorder in her mother; Cancer in her father; Hearing loss in her sister; Pneumonia in her mother; Ulcers in her mother.She reports that she has never smoked. She has never used smokeless tobacco. She reports that she does not drink alcohol and does not use drugs.  Current Outpatient Medications on File Prior to Visit  Medication Sig Dispense Refill  . acetaminophen (TYLENOL) 650 MG CR tablet Take 650 mg by mouth every 8 (eight) hours as needed for pain.    . calcium citrate-vitamin D (CITRACAL+D) 315-200 MG-UNIT tablet Take 1 tablet by mouth daily.    . cetirizine (ZYRTEC) 10 MG tablet Take 1 tablet (10 mg total) by mouth daily. 90 tablet 3  . cholecalciferol (VITAMIN D3) 25 MCG (1000 UNIT) tablet Take 1 tablet (1,000 Units total)  by mouth daily. 90 tablet 3  . Cyanocobalamin (VITAMIN B 12 PO) Take 1 tablet by mouth daily.     No current facility-administered medications on file prior to visit.    ROS Review of Systems  Constitutional: Negative.   HENT: Negative.   Eyes: Negative for visual disturbance.  Respiratory: Negative for shortness of breath.   Cardiovascular: Positive for chest pain.  Gastrointestinal: Negative for abdominal pain.  Genitourinary: Positive for vaginal pain (labila itching and burning).  Musculoskeletal: Negative for arthralgias.    Objective:  Temp (!) 97.2 F (36.2 C) (Temporal)   Resp 20   Ht 5' 1"  (1.549 m)   Wt 149 lb 4 oz (67.7 kg)   SpO2 96%   BMI 28.20 kg/m   BP Readings from Last 3 Encounters:  05/12/20 116/70  01/13/20 130/70  09/07/19 118/66    Wt Readings from Last 3 Encounters:  11/15/20 149 lb 4 oz (67.7 kg)  05/12/20 148 lb 6.4 oz (67.3 kg)  01/13/20 148 lb 9.6 oz (67.4 kg)     Physical Exam Constitutional:      General: She is not in acute distress.    Appearance: She is well-developed.  HENT:     Head: Normocephalic and atraumatic.  Eyes:     Conjunctiva/sclera: Conjunctivae normal.     Pupils: Pupils are equal, round, and reactive to light.  Neck:     Thyroid: No thyromegaly.  Cardiovascular:  Rate and Rhythm: Normal rate and regular rhythm.     Heart sounds: Normal heart sounds. No murmur heard.   Pulmonary:     Effort: Pulmonary effort is normal. No respiratory distress.     Breath sounds: Normal breath sounds. No wheezing or rales.  Abdominal:     General: Bowel sounds are normal. There is no distension.     Palpations: Abdomen is soft.     Tenderness: There is no abdominal tenderness.  Musculoskeletal:        General: Normal range of motion.     Cervical back: Normal range of motion and neck supple.  Lymphadenopathy:     Cervical: No cervical adenopathy.  Skin:    General: Skin is warm and dry.  Neurological:     Mental  Status: She is alert and oriented to person, place, and time.  Psychiatric:        Behavior: Behavior normal.        Thought Content: Thought content normal.        Judgment: Judgment normal.       Assessment & Plan:   Lauren Stokes was seen today for medical management of chronic issues.  Diagnoses and all orders for this visit:  Essential hypertension -     CBC with Differential/Platelet -     CMP14+EGFR -     Lipid panel -     losartan-hydrochlorothiazide (HYZAAR) 100-25 MG tablet; Take 1 tablet by mouth daily.  GAD (generalized anxiety disorder) -     venlafaxine XR (EFFEXOR-XR) 75 MG 24 hr capsule; Take 1 capsule (75 mg total) by mouth daily. -     LORazepam (ATIVAN) 0.5 MG tablet; Take 1 tablet (0.5 mg total) by mouth at bedtime. -     ToxASSURE Select 13 (MW), Urine  Non-seasonal allergic rhinitis, unspecified trigger -     montelukast (SINGULAIR) 10 MG tablet; Take 1 tablet (10 mg total) by mouth daily. -     fluticasone (FLONASE) 50 MCG/ACT nasal spray; Place 1 spray into both nostrils daily.  Gastroesophageal reflux disease with esophagitis, unspecified whether hemorrhage  Benzodiazepine dependence ([place]) -     ToxASSURE Select 13 (MW), Urine  Other chest pain -     Ambulatory referral to Cardiology  Other orders -     omeprazole-sodium bicarbonate (ZEGERID) 40-1100 MG capsule; TAKE 1 CAPSULE EVERY MORNING BEFORE BREAKFAST -     meloxicam (MOBIC) 7.5 MG tablet; Take 1 tablet (7.5 mg total) by mouth 2 (two) times daily. -     clotrimazole-betamethasone (LOTRISONE) cream; Apply 1 application topically 2 (two) times daily. To affected areas until rash clears   Allergies as of 11/15/2020      Reactions   Cefaclor Nausea And Vomiting   Penicillins Rash   Tetracyclines & Related Rash   vomiting      Medication List       Accurate as of November 15, 2020 10:36 PM. If you have any questions, ask your nurse or doctor.        acetaminophen 650 MG CR tablet Commonly  known as: TYLENOL Take 650 mg by mouth every 8 (eight) hours as needed for pain.   calcium citrate-vitamin D 315-200 MG-UNIT tablet Commonly known as: CITRACAL+D Take 1 tablet by mouth daily.   cetirizine 10 MG tablet Commonly known as: ZYRTEC Take 1 tablet (10 mg total) by mouth daily.   cholecalciferol 25 MCG (1000 UNIT) tablet Commonly known as: VITAMIN D3 Take 1 tablet (1,000 Units total)  by mouth daily.   clotrimazole-betamethasone cream Commonly known as: Lotrisone Apply 1 application topically 2 (two) times daily. To affected areas until rash clears Started by: Claretta Fraise, MD   fluticasone 50 MCG/ACT nasal spray Commonly known as: FLONASE Place 1 spray into both nostrils daily.   LORazepam 0.5 MG tablet Commonly known as: ATIVAN Take 1 tablet (0.5 mg total) by mouth at bedtime.   losartan-hydrochlorothiazide 100-25 MG tablet Commonly known as: HYZAAR Take 1 tablet by mouth daily.   meloxicam 7.5 MG tablet Commonly known as: MOBIC Take 1 tablet (7.5 mg total) by mouth 2 (two) times daily.   montelukast 10 MG tablet Commonly known as: SINGULAIR Take 1 tablet (10 mg total) by mouth daily.   omeprazole-sodium bicarbonate 40-1100 MG capsule Commonly known as: ZEGERID TAKE 1 CAPSULE EVERY MORNING BEFORE BREAKFAST   venlafaxine XR 75 MG 24 hr capsule Commonly known as: EFFEXOR-XR Take 1 capsule (75 mg total) by mouth daily.   VITAMIN B 12 PO Take 1 tablet by mouth daily.       Meds ordered this encounter  Medications  . venlafaxine XR (EFFEXOR-XR) 75 MG 24 hr capsule    Sig: Take 1 capsule (75 mg total) by mouth daily.    Dispense:  90 capsule    Refill:  1  . omeprazole-sodium bicarbonate (ZEGERID) 40-1100 MG capsule    Sig: TAKE 1 CAPSULE EVERY MORNING BEFORE BREAKFAST    Dispense:  90 capsule    Refill:  1  . montelukast (SINGULAIR) 10 MG tablet    Sig: Take 1 tablet (10 mg total) by mouth daily.    Dispense:  90 tablet    Refill:  1  .  meloxicam (MOBIC) 7.5 MG tablet    Sig: Take 1 tablet (7.5 mg total) by mouth 2 (two) times daily.    Dispense:  180 tablet    Refill:  1  . losartan-hydrochlorothiazide (HYZAAR) 100-25 MG tablet    Sig: Take 1 tablet by mouth daily.    Dispense:  90 tablet    Refill:  1  . fluticasone (FLONASE) 50 MCG/ACT nasal spray    Sig: Place 1 spray into both nostrils daily.    Dispense:  18.2 mL    Refill:  5  . LORazepam (ATIVAN) 0.5 MG tablet    Sig: Take 1 tablet (0.5 mg total) by mouth at bedtime.    Dispense:  90 tablet    Refill:  0  . clotrimazole-betamethasone (LOTRISONE) cream    Sig: Apply 1 application topically 2 (two) times daily. To affected areas until rash clears    Dispense:  45 g    Refill:  1      Follow-up: Return in about 6 months (around 05/16/2021).  Claretta Fraise, M.D.

## 2020-11-18 ENCOUNTER — Ambulatory Visit: Payer: Medicare Other | Admitting: Family Medicine

## 2020-11-22 LAB — TOXASSURE SELECT 13 (MW), URINE

## 2020-11-23 ENCOUNTER — Ambulatory Visit (INDEPENDENT_AMBULATORY_CARE_PROVIDER_SITE_OTHER): Payer: Medicare Other

## 2020-11-23 ENCOUNTER — Other Ambulatory Visit: Payer: Self-pay

## 2020-11-23 DIAGNOSIS — Z23 Encounter for immunization: Secondary | ICD-10-CM | POA: Diagnosis not present

## 2020-11-23 NOTE — Progress Notes (Signed)
   Covid-19 Vaccination Clinic  Name:  Lauren Stokes    MRN: [id number] DOB: 01-26-1946  11/23/2020  [name] was observed post Covid-19 immunization for 15 minutes without incident. She was provided with Vaccine Information Sheet and instruction to access the V-Safe system.   [name] was instructed to call 911 with any severe reactions post vaccine: Marland Kitchen Difficulty breathing  . Swelling of face and throat  . A fast heartbeat  . A bad rash all over body  . Dizziness and weakness   Immunizations Administered    No immunizations on file.

## 2020-11-23 NOTE — Progress Notes (Signed)
Hello Shalie,  Your lab result is normal and/or stable.Some minor variations that are not significant are commonly marked abnormal, but do not represent any medical problem for you.  Best regards, Estella Malatesta, M.D.

## 2020-11-24 ENCOUNTER — Other Ambulatory Visit: Payer: Self-pay | Admitting: *Deleted

## 2020-11-24 MED ORDER — ALENDRONATE SODIUM 70 MG PO TABS: 70.0000 mg | ORAL_TABLET | ORAL | 1 refills | Status: DC

## 2021-01-03 DIAGNOSIS — R072 Precordial pain: Secondary | ICD-10-CM | POA: Insufficient documentation

## 2021-01-03 NOTE — Progress Notes (Signed)
Cardiology Office Note   Date:  01/04/2021   ID:  Layton, Tappan 07-Jun-1946, MRN [id number]  PCP:  Mechele Claude, MD  Cardiologist:   No primary care provider on file. Referring:  Mechele Claude, MD  No chief complaint on file.     History of Present Illness: Lauren Stokes is a 75 y.o. female who is referred by Mechele Claude, MD for evaluation of chest pain.  She has had no past cardiac history.  She has been getting chest discomfort.  She says it is happening sporadically.  She is not sure when it started but thinks it was relatively recent.  She points to her mid chest.  It is sharp.  It does not radiate to her jaw or to her arms.  She does not necessarily describe shortness of breath although she says she does have some dyspnea with exertion.  She does not describe associated nausea or vomiting.  She has never had this kind of pain before.  She says it happens at rest and with the activities that she is which include taking care of her house and her sister who has dementia she does not necessarily bring this on.  She has never had any prior cardiac work-up.  Past Medical History:  Diagnosis Date  . Allergy   . Depression   . Essential tremor   . GERD (gastroesophageal reflux disease)   . Hypertension     Past Surgical History:  Procedure Laterality Date  . CHOLECYSTECTOMY    . COSMETIC SURGERY     facial  (born deformed)      Current Outpatient Medications  Medication Sig Dispense Refill  . acetaminophen (TYLENOL) 650 MG CR tablet Take 650 mg by mouth every 8 (eight) hours as needed for pain.    Marland Kitchen alendronate (FOSAMAX) 70 MG tablet Take 1 tablet (70 mg total) by mouth every 7 (seven) days. Take with a full glass of water on an empty stomach. 13 tablet 1  . calcium citrate-vitamin D (CITRACAL+D) 315-200 MG-UNIT tablet Take 1 tablet by mouth daily.    . cetirizine (ZYRTEC) 10 MG tablet Take 1 tablet (10 mg total) by mouth daily. 90 tablet 3  . cholecalciferol  (VITAMIN D3) 25 MCG (1000 UNIT) tablet Take 1 tablet (1,000 Units total) by mouth daily. 90 tablet 3  . clotrimazole-betamethasone (LOTRISONE) cream Apply 1 application topically 2 (two) times daily. To affected areas until rash clears 45 g 1  . Cyanocobalamin (VITAMIN B 12 PO) Take 1 tablet by mouth daily.    . fluticasone (FLONASE) 50 MCG/ACT nasal spray Place 1 spray into both nostrils daily. 18.2 mL 5  . LORazepam (ATIVAN) 0.5 MG tablet Take 1 tablet (0.5 mg total) by mouth at bedtime. 90 tablet 0  . losartan-hydrochlorothiazide (HYZAAR) 100-25 MG tablet Take 1 tablet by mouth daily. 90 tablet 1  . meloxicam (MOBIC) 7.5 MG tablet Take 1 tablet (7.5 mg total) by mouth 2 (two) times daily. 180 tablet 1  . montelukast (SINGULAIR) 10 MG tablet Take 1 tablet (10 mg total) by mouth daily. 90 tablet 1  . omeprazole-sodium bicarbonate (ZEGERID) 40-1100 MG capsule TAKE 1 CAPSULE EVERY MORNING BEFORE BREAKFAST 90 capsule 1  . venlafaxine XR (EFFEXOR-XR) 75 MG 24 hr capsule Take 1 capsule (75 mg total) by mouth daily. 90 capsule 1   No current facility-administered medications for this visit.    Allergies:   Cefaclor, Penicillins, and Tetracyclines & related    Social History:  The patient  reports that she has never smoked. She has never used smokeless tobacco. She reports that she does not drink alcohol and does not use drugs.   Family History:  The patient's family history includes Aneurysm in her mother; Anxiety disorder in her mother; Cancer in her father; Hearing loss in her sister; Pneumonia in her mother; Ulcers in her mother.    ROS:  Please see the history of present illness.   Otherwise, review of systems are positive for none.   All other systems are reviewed and negative.    PHYSICAL EXAM: VS:  BP 110/74   Pulse 82   Ht 5\' 1"  (1.549 m)   Wt 148 lb (67.1 kg)   BMI 27.96 kg/m  , BMI Body mass index is 27.96 kg/m. GENERAL:  Well appearing HEENT:  Pupils equal round and reactive,  fundi not visualized, oral mucosa unremarkable NECK:  No jugular venous distention, waveform within normal limits, carotid upstroke brisk and symmetric, no bruits, no thyromegaly LYMPHATICS:  No cervical, inguinal adenopathy LUNGS:  Clear to auscultation bilaterally BACK:  No CVA tenderness CHEST:  Unremarkable HEART:  PMI not displaced or sustained,S1 and S2 within normal limits, no S3, no S4, no clicks, no rubs, no murmurs ABD:  Flat, positive bowel sounds normal in frequency in pitch, no bruits, no rebound, no guarding, no midline pulsatile mass, no hepatomegaly, no splenomegaly EXT:  2 plus pulses throughout, no edema, no cyanosis no clubbing SKIN:  No rashes no nodules NEURO:  Cranial nerves II through XII grossly intact, motor grossly intact throughout PSYCH:  Cognitively intact, oriented to person place and time    EKG:  EKG is ordered today. The ekg ordered today demonstrates sinus rhythm, rate 82, left axis deviation, left anterior fascicular block, no acute ST-T wave changes.   Recent Labs: 01/06/2020: TSH 1.870 11/15/2020: ALT 27; BUN 24; Creatinine, Ser 0.68; Hemoglobin 12.6; Platelets 309; Potassium 3.9; Sodium 140    Lipid Panel    Component Value Date/Time   CHOL 136 11/15/2020 1128   TRIG 83 11/15/2020 1128   HDL 63 11/15/2020 1128   CHOLHDL 2.2 11/15/2020 1128   LDLCALC 57 11/15/2020 1128      Wt Readings from Last 3 Encounters:  01/04/21 148 lb (67.1 kg)  11/15/20 149 lb 4 oz (67.7 kg)  05/12/20 148 lb 6.4 oz (67.3 kg)      Other studies Reviewed: Additional studies/ records that were reviewed today include: Labs. Review of the above records demonstrates:  Please see elsewhere in the note.     ASSESSMENT AND PLAN:  CHEST PAIN:    The patient has chest discomfort as described.  I think the pretest probability of obstructive coronary disease is low.  I suggested a POET (Plain Old Exercise Treadmill) but she does not think she would be able to walk on a  treadmill.  She has had some foot problems chronically.  Therefore, she will need a 07/12/20.  HTN:  The blood pressure is at target. No change in medications is indicated. We will continue with therapeutic lifestyle changes (TLC).    Current medicines are reviewed at length with the patient today.  The patient does not have concerns regarding medicines.  The following changes have been made:  no change  Labs/ tests ordered today include:   Orders Placed This Encounter  Procedures  . NM Myocar Multi W/Spect W/Wall Motion / EF  . EKG 12-Lead     Disposition:  FU with me based on the results of the above.   Signed, Rollene Rotunda, MD  01/04/2021 3:18 PM    [facility]

## 2021-01-04 ENCOUNTER — Ambulatory Visit (INDEPENDENT_AMBULATORY_CARE_PROVIDER_SITE_OTHER): Payer: Medicare Other | Admitting: Cardiology

## 2021-01-04 ENCOUNTER — Encounter: Payer: Self-pay | Admitting: Cardiology

## 2021-01-04 ENCOUNTER — Other Ambulatory Visit: Payer: Self-pay

## 2021-01-04 VITALS — BP 110/74 | HR 82 | Ht 61.0 in | Wt 148.0 lb

## 2021-01-04 DIAGNOSIS — R072 Precordial pain: Secondary | ICD-10-CM | POA: Diagnosis not present

## 2021-01-04 NOTE — Patient Instructions (Signed)
Medication Instructions:  The current medical regimen is effective;  continue present plan and medications.  *If you need a refill on your cardiac medications before your next appointment, please call your pharmacy*  Testing/Procedures: Your physician has requested that you have a lexiscan myoview.  This will be completed at [facility].  You will check in at the main enterance.   Nothing to eat or drink 4 hours before except you can have water.  No caffeine or decaff 12 hours before.  You may take your normal medications this AM.    Follow-Up: At [facility], you and your health needs are our priority.  As part of our continuing mission to provide you with exceptional heart care, we have created designated Provider Care Teams.  These Care Teams include your primary Cardiologist (physician) and Advanced Practice Providers (APPs -  Physician Assistants and Nurse Practitioners) who all work together to provide you with the care you need, when you need it.  We recommend signing up for the patient portal called "MyChart".  Sign up information is provided on this After Visit Summary.  MyChart is used to connect with patients for Virtual Visits (Telemedicine).  Patients are able to view lab/test results, encounter notes, upcoming appointments, etc.  Non-urgent messages can be sent to your provider as well.   To learn more about what you can do with MyChart, go to [email or web address].    Your next appointment:   Follow up based on the results of the above testing.  Thank you for choosing [facility]!!

## 2021-01-04 NOTE — Addendum Note (Signed)
Addended by: Sharin Grave on: 01/04/2021 03:53 PM   Modules accepted: Orders

## 2021-01-10 ENCOUNTER — Encounter: Payer: Self-pay | Admitting: Cardiology

## 2021-01-11 NOTE — Addendum Note (Signed)
Addended by: Kirah Stice on: 01/11/2021 11:34 AM   Modules accepted: Orders  

## 2021-01-26 ENCOUNTER — Other Ambulatory Visit: Payer: Self-pay

## 2021-01-26 ENCOUNTER — Encounter (HOSPITAL_BASED_OUTPATIENT_CLINIC_OR_DEPARTMENT_OTHER)
Admission: RE | Admit: 2021-01-26 | Discharge: 2021-01-26 | Disposition: A | Discharge status: Home / Self Care | Payer: Medicare Other | Source: Ambulatory Visit | Attending: Cardiology | Admitting: Cardiology

## 2021-01-26 ENCOUNTER — Encounter (HOSPITAL_COMMUNITY)
Admission: RE | Admit: 2021-01-26 | Discharge: 2021-01-26 | Disposition: A | Discharge status: Home / Self Care | Payer: Medicare Other | Source: Ambulatory Visit | Attending: Cardiology | Admitting: Cardiology

## 2021-01-26 ENCOUNTER — Encounter (HOSPITAL_COMMUNITY): Payer: Self-pay

## 2021-01-26 DIAGNOSIS — R072 Precordial pain: Secondary | ICD-10-CM

## 2021-01-26 HISTORY — DX: Systemic involvement of connective tissue, unspecified: M35.9

## 2021-01-26 LAB — NM MYOCAR MULTI W/SPECT W/WALL MOTION / EF
LV dias vol: 31 mL (ref 46–106)
LV sys vol: 8 mL
Peak HR: 107 {beats}/min
RATE: 0.32
Rest HR: 76 {beats}/min
SDS: 3
SRS: 2
SSS: 5
TID: 0.9

## 2021-01-26 MED ORDER — TECHNETIUM TC 99M TETROFOSMIN IV KIT
10.0000 | PACK | Freq: Once | INTRAVENOUS | Status: AC | PRN
  Administered 2021-01-26: 11 via INTRAVENOUS

## 2021-01-26 MED ORDER — SODIUM CHLORIDE FLUSH 0.9 % IV SOLN
INTRAVENOUS | Status: AC
  Administered 2021-01-26: 10 mL via INTRAVENOUS
  Filled 2021-01-26: qty 10

## 2021-01-26 MED ORDER — REGADENOSON 0.4 MG/5ML IV SOLN
INTRAVENOUS | Status: AC
  Administered 2021-01-26: 0.4 mg via INTRAVENOUS
  Filled 2021-01-26: qty 5

## 2021-01-26 MED ORDER — TECHNETIUM TC 99M TETROFOSMIN IV KIT
30.0000 | PACK | Freq: Once | INTRAVENOUS | Status: AC | PRN
  Administered 2021-01-26: 33 via INTRAVENOUS

## 2021-02-22 ENCOUNTER — Other Ambulatory Visit: Payer: Self-pay | Admitting: Family Medicine

## 2021-02-22 DIAGNOSIS — F411 Generalized anxiety disorder: Secondary | ICD-10-CM

## 2021-03-18 ENCOUNTER — Other Ambulatory Visit: Payer: Self-pay | Admitting: Family Medicine

## 2021-05-15 ENCOUNTER — Other Ambulatory Visit: Payer: Medicare Other

## 2021-05-15 ENCOUNTER — Other Ambulatory Visit: Payer: Self-pay

## 2021-05-15 DIAGNOSIS — I10 Essential (primary) hypertension: Secondary | ICD-10-CM | POA: Diagnosis not present

## 2021-05-15 DIAGNOSIS — K2100 Gastro-esophageal reflux disease with esophagitis, without bleeding: Secondary | ICD-10-CM | POA: Diagnosis not present

## 2021-05-15 DIAGNOSIS — Z0000 Encounter for general adult medical examination without abnormal findings: Secondary | ICD-10-CM | POA: Diagnosis not present

## 2021-05-15 DIAGNOSIS — E559 Vitamin D deficiency, unspecified: Secondary | ICD-10-CM | POA: Diagnosis not present

## 2021-05-15 LAB — CBC WITH DIFFERENTIAL/PLATELET
Basophils Absolute: 0.1 10*3/uL (ref 0.0–0.2)
Basos: 1 %
EOS (ABSOLUTE): 0.2 10*3/uL (ref 0.0–0.4)
Eos: 2 %
Hematocrit: 37.2 % (ref 34.0–46.6)
Hemoglobin: 12.6 g/dL (ref 11.1–15.9)
Immature Grans (Abs): 0 10*3/uL (ref 0.0–0.1)
Immature Granulocytes: 0 %
Lymphocytes Absolute: 2.4 10*3/uL (ref 0.7–3.1)
Lymphs: 29 %
MCH: 30.6 pg (ref 26.6–33.0)
MCHC: 33.9 g/dL (ref 31.5–35.7)
MCV: 90 fL (ref 79–97)
Monocytes Absolute: 0.8 10*3/uL (ref 0.1–0.9)
Monocytes: 9 %
Neutrophils Absolute: 4.9 10*3/uL (ref 1.4–7.0)
Neutrophils: 59 %
Platelets: 298 10*3/uL (ref 150–450)
RBC: 4.12 x10E6/uL (ref 3.77–5.28)
RDW: 11.8 % (ref 11.7–15.4)
WBC: 8.4 10*3/uL (ref 3.4–10.8)

## 2021-05-15 LAB — CMP14+EGFR
ALT: 23 IU/L (ref 0–32)
AST: 19 IU/L (ref 0–40)
Albumin/Globulin Ratio: 1.4 (ref 1.2–2.2)
Albumin: 4.1 g/dL (ref 3.7–4.7)
Alkaline Phosphatase: 71 IU/L (ref 44–121)
BUN/Creatinine Ratio: 28 (ref 12–28)
BUN: 19 mg/dL (ref 8–27)
Bilirubin Total: 0.3 mg/dL (ref 0.0–1.2)
CO2: 25 mmol/L (ref 20–29)
Calcium: 9.3 mg/dL (ref 8.7–10.3)
Chloride: 102 mmol/L (ref 96–106)
Creatinine, Ser: 0.69 mg/dL (ref 0.57–1.00)
Globulin, Total: 2.9 g/dL (ref 1.5–4.5)
Glucose: 130 mg/dL — ABNORMAL HIGH (ref 65–99)
Potassium: 4 mmol/L (ref 3.5–5.2)
Sodium: 140 mmol/L (ref 134–144)
Total Protein: 7 g/dL (ref 6.0–8.5)
eGFR: 92 mL/min/{1.73_m2} (ref >59)

## 2021-05-15 LAB — LIPID PANEL
Chol/HDL Ratio: 2.2 ratio (ref 0.0–4.4)
Cholesterol, Total: 120 mg/dL (ref 100–199)
HDL: 55 mg/dL (ref >39)
LDL Chol Calc (NIH): 52 mg/dL (ref 0–99)
Triglycerides: 62 mg/dL (ref 0–149)
VLDL Cholesterol Cal: 13 mg/dL (ref 5–40)

## 2021-05-16 ENCOUNTER — Ambulatory Visit (INDEPENDENT_AMBULATORY_CARE_PROVIDER_SITE_OTHER): Payer: Medicare Other | Admitting: Family Medicine

## 2021-05-16 ENCOUNTER — Ambulatory Visit (INDEPENDENT_AMBULATORY_CARE_PROVIDER_SITE_OTHER): Payer: Medicare Other

## 2021-05-16 ENCOUNTER — Encounter: Payer: Self-pay | Admitting: Family Medicine

## 2021-05-16 VITALS — BP 91/57 | HR 84 | Temp 98.2°F | Ht 61.0 in | Wt 151.0 lb

## 2021-05-16 DIAGNOSIS — F411 Generalized anxiety disorder: Secondary | ICD-10-CM | POA: Diagnosis not present

## 2021-05-16 DIAGNOSIS — F1320 Sedative, hypnotic or anxiolytic dependence, uncomplicated: Secondary | ICD-10-CM

## 2021-05-16 DIAGNOSIS — M25562 Pain in left knee: Secondary | ICD-10-CM | POA: Diagnosis not present

## 2021-05-16 DIAGNOSIS — J3089 Other allergic rhinitis: Secondary | ICD-10-CM | POA: Diagnosis not present

## 2021-05-16 DIAGNOSIS — I10 Essential (primary) hypertension: Secondary | ICD-10-CM | POA: Diagnosis not present

## 2021-05-16 DIAGNOSIS — M1712 Unilateral primary osteoarthritis, left knee: Secondary | ICD-10-CM | POA: Diagnosis not present

## 2021-05-16 DIAGNOSIS — M1710 Unilateral primary osteoarthritis, unspecified knee: Secondary | ICD-10-CM

## 2021-05-16 DIAGNOSIS — K2100 Gastro-esophageal reflux disease with esophagitis, without bleeding: Secondary | ICD-10-CM

## 2021-05-16 DIAGNOSIS — F331 Major depressive disorder, recurrent, moderate: Secondary | ICD-10-CM | POA: Diagnosis not present

## 2021-05-16 MED ORDER — OMEPRAZOLE-SODIUM BICARBONATE 40-1100 MG PO CAPS: ORAL_CAPSULE | ORAL | 1 refills | Status: DC

## 2021-05-16 MED ORDER — DICLOFENAC SODIUM 75 MG PO TBEC: 75.0000 mg | DELAYED_RELEASE_TABLET | Freq: Two times a day (BID) | ORAL | 3 refills | Status: DC

## 2021-05-16 MED ORDER — MONTELUKAST SODIUM 10 MG PO TABS: 10.0000 mg | ORAL_TABLET | Freq: Every day | ORAL | 1 refills | Status: DC

## 2021-05-16 MED ORDER — LOSARTAN POTASSIUM 50 MG PO TABS: 50.0000 mg | ORAL_TABLET | Freq: Every day | ORAL | 1 refills | Status: DC

## 2021-05-16 MED ORDER — VENLAFAXINE HCL ER 75 MG PO CP24: 75.0000 mg | ORAL_CAPSULE | Freq: Every day | ORAL | 1 refills | Status: DC

## 2021-05-16 MED ORDER — LORAZEPAM 0.5 MG PO TABS: 0.5000 mg | ORAL_TABLET | Freq: Every day | ORAL | 1 refills | Status: DC

## 2021-05-16 NOTE — Progress Notes (Signed)
Subjective:  Patient ID: Lauren Stokes, female    DOB: Mar 21, 1946  Age: 75 y.o. MRN: [id number]  CC: Medical Management of Chronic Issues   HPI SHUNTAE HERZIG presents for  follow-up of hypertension. Patient has no history of headache chest pain or shortness of breath or recent cough. Patient also denies symptoms of TIA such as focal numbness or weakness. Patient denies side effects from medication. States taking it regularly.BP usually 120-130s systolic at doctors offices.  GAD 7 : Generalized Anxiety Score 05/16/2021 11/15/2020 05/12/2020  Nervous, Anxious, on Edge 0 0 0  Control/stop worrying 0 0 0  Worry too much - different things 1 0 0  Trouble relaxing 0 0 0  Restless 0 0 0  Easily annoyed or irritable 1 0 0  Afraid - awful might happen 0 0 0  Total GAD 7 Score 2 0 0  Anxiety Difficulty Not difficult at all - Somewhat difficult     Depression screen [facility] 2/9 05/16/2021 05/16/2021 11/15/2020 05/12/2020 01/13/2020  Decreased Interest 0 0 0 0 0  Down, Depressed, Hopeless 0 0 0 1 0  PHQ - 2 Score 0 0 0 1 0  Altered sleeping 0 - - - -  Tired, decreased energy 1 - - - -  Feeling bad or failure about yourself  0 - - - -  Trouble concentrating 0 - - - -  Moving slowly or fidgety/restless 0 - - - -  Suicidal thoughts 0 - - - -  PHQ-9 Score 1 - - - -  Difficult doing work/chores Not difficult at all - - - -      History Kaylyne has a past medical history of Allergy, Collagen vascular disease (HCC), Depression, Essential tremor, GERD (gastroesophageal reflux disease), and Hypertension.   She has a past surgical history that includes Cosmetic surgery and Cholecystectomy.   Her family history includes Aneurysm in her mother; Anxiety disorder in her mother; Cancer in her father; Hearing loss in her sister; Pneumonia in her mother; Ulcers in her mother.She reports that she has never smoked. She has never used smokeless tobacco. She reports that she does not drink alcohol and does not use  drugs.  Current Outpatient Medications on File Prior to Visit  Medication Sig Dispense Refill  . alendronate (FOSAMAX) 70 MG tablet TAKE 1 TABLET WEEKLY (TAKE WITH 8OZ OF WATER 30 MINUTES BEFORE BREAKFAST) 12 tablet 0  . Boswell[place]-Glucosamine-Vit D (OSTEO BI-FLEX ONE PER DAY PO) Take by mouth.    . calcium citrate-vitamin D (CITRACAL+D) 315-200 MG-UNIT tablet Take 1 tablet by mouth daily.    . cetirizine (ZYRTEC) 10 MG tablet Take 1 tablet (10 mg total) by mouth daily. 90 tablet 3  . cholecalciferol (VITAMIN D3) 25 MCG (1000 UNIT) tablet Take 1 tablet (1,000 Units total) by mouth daily. 90 tablet 3  . clotrimazole-betamethasone (LOTRISONE) cream Apply 1 application topically 2 (two) times daily. To affected areas until rash clears 45 g 1  . Cyanocobalamin (VITAMIN B 12 PO) Take 1 tablet by mouth daily.    . fluticasone (FLONASE) 50 MCG/ACT nasal spray Place 1 spray into both nostrils daily. 18.2 mL 5  . acetaminophen (TYLENOL) 650 MG CR tablet Take 650 mg by mouth every 8 (eight) hours as needed for pain.     No current facility-administered medications on file prior to visit.    ROS Review of Systems  Constitutional: Negative.   HENT: Positive for postnasal drip and rhinorrhea.   Eyes: Negative for  visual disturbance.  Respiratory: Negative for shortness of breath.   Cardiovascular: Negative for chest pain.  Gastrointestinal: Negative for abdominal pain.  Musculoskeletal: Positive for arthralgias (left leg at knee).    Objective:  BP (!) 91/57   Pulse 84   Temp 98.2 F (36.8 C)   Ht 5\' 1"  (1.549 m)   Wt 151 lb (68.5 kg)   SpO2 96%   BMI 28.53 kg/m   BP Readings from Last 3 Encounters:  05/16/21 (!) 91/57  01/04/21 110/74  05/12/20 116/70    Wt Readings from Last 3 Encounters:  05/16/21 151 lb (68.5 kg)  01/04/21 148 lb (67.1 kg)  11/15/20 149 lb 4 oz (67.7 kg)     Physical Exam Constitutional:      General: She is not in acute distress.    Appearance: She is  well-developed.  HENT:     Head: Normocephalic and atraumatic.  Eyes:     Conjunctiva/sclera: Conjunctivae normal.     Pupils: Pupils are equal, round, and reactive to light.  Neck:     Thyroid: No thyromegaly.  Cardiovascular:     Rate and Rhythm: Normal rate and regular rhythm.     Heart sounds: Normal heart sounds. No murmur heard.   Pulmonary:     Effort: Pulmonary effort is normal. No respiratory distress.     Breath sounds: Normal breath sounds. No wheezing or rales.  Abdominal:     General: Bowel sounds are normal. There is no distension.     Palpations: Abdomen is soft.     Tenderness: There is no abdominal tenderness.  Musculoskeletal:        General: Normal range of motion.     Cervical back: Normal range of motion and neck supple.  Lymphadenopathy:     Cervical: No cervical adenopathy.  Skin:    General: Skin is warm and dry.  Neurological:     Mental Status: She is alert and oriented to person, place, and time.  Psychiatric:        Behavior: Behavior normal.        Thought Content: Thought content normal.        Judgment: Judgment normal.       Assessment & Plan:   Rula was seen today for medical management of chronic issues.  Diagnoses and all orders for this visit:  Moderate episode of recurrent major depressive disorder (HCC) -     venlafaxine XR (EFFEXOR-XR) 75 MG 24 hr capsule; Take 1 capsule (75 mg total) by mouth daily.  GAD (generalized anxiety disorder) -     LORazepam (ATIVAN) 0.5 MG tablet; Take 1 tablet (0.5 mg total) by mouth at bedtime. -     venlafaxine XR (EFFEXOR-XR) 75 MG 24 hr capsule; Take 1 capsule (75 mg total) by mouth daily.  Essential hypertension -     losartan (COZAAR) 50 MG tablet; Take 1 tablet (50 mg total) by mouth daily.  Non-seasonal allergic rhinitis, unspecified trigger -     montelukast (SINGULAIR) 10 MG tablet; Take 1 tablet (10 mg total) by mouth daily.  Benzodiazepine dependence (HCC) -     LORazepam  (ATIVAN) 0.5 MG tablet; Take 1 tablet (0.5 mg total) by mouth at bedtime.  Arthritis of knee -     diclofenac (VOLTAREN) 75 MG EC tablet; Take 1 tablet (75 mg total) by mouth 2 (two) times daily. For muscle and  Joint pain -     DG Knee 1-2 Views Left; Future  Gastroesophageal  reflux disease with esophagitis, unspecified whether hemorrhage -     omeprazole-sodium bicarbonate (ZEGERID) 40-1100 MG capsule; TAKE 1 CAPSULE EVERY MORNING BEFORE BREAKFAST   Allergies as of 05/16/2021      Reactions   Cefaclor Nausea And Vomiting   Penicillins Rash   Tetracyclines & Related Rash   vomiting      Medication List       Accurate as of May 16, 2021 11:59 PM. If you have any questions, ask your nurse or doctor.        STOP taking these medications   losartan-hydrochlorothiazide 100-25 MG tablet Commonly known as: HYZAAR Stopped by: Mechele ClaudeWarren Farha Dano, MD   meloxicam 7.5 MG tablet Commonly known as: MOBIC Stopped by: Mechele ClaudeWarren Zaccai Chavarin, MD     TAKE these medications   acetaminophen 650 MG CR tablet Commonly known as: TYLENOL Take 650 mg by mouth every 8 (eight) hours as needed for pain.   alendronate 70 MG tablet Commonly known as: FOSAMAX TAKE 1 TABLET WEEKLY (TAKE WITH 8OZ OF WATER 30 MINUTES BEFORE BREAKFAST)   calcium citrate-vitamin D 315-200 MG-UNIT tablet Commonly known as: CITRACAL+D Take 1 tablet by mouth daily.   cetirizine 10 MG tablet Commonly known as: ZYRTEC Take 1 tablet (10 mg total) by mouth daily.   cholecalciferol 25 MCG (1000 UNIT) tablet Commonly known as: VITAMIN D3 Take 1 tablet (1,000 Units total) by mouth daily.   clotrimazole-betamethasone cream Commonly known as: Lotrisone Apply 1 application topically 2 (two) times daily. To affected areas until rash clears   diclofenac 75 MG EC tablet Commonly known as: VOLTAREN Take 1 tablet (75 mg total) by mouth 2 (two) times daily. For muscle and  Joint pain Started by: Mechele ClaudeWarren Demonta Wombles, MD   fluticasone 50 MCG/ACT  nasal spray Commonly known as: FLONASE Place 1 spray into both nostrils daily.   LORazepam 0.5 MG tablet Commonly known as: ATIVAN Take 1 tablet (0.5 mg total) by mouth at bedtime.   losartan 50 MG tablet Commonly known as: COZAAR Take 1 tablet (50 mg total) by mouth daily. Started by: Mechele ClaudeWarren Montgomery Rothlisberger, MD   montelukast 10 MG tablet Commonly known as: SINGULAIR Take 1 tablet (10 mg total) by mouth daily.   omeprazole-sodium bicarbonate 40-1100 MG capsule Commonly known as: ZEGERID TAKE 1 CAPSULE EVERY MORNING BEFORE BREAKFAST   OSTEO BI-FLEX ONE PER DAY PO Take by mouth.   venlafaxine XR 75 MG 24 hr capsule Commonly known as: EFFEXOR-XR Take 1 capsule (75 mg total) by mouth daily.   VITAMIN B 12 PO Take 1 tablet by mouth daily.       Meds ordered this encounter  Medications  . LORazepam (ATIVAN) 0.5 MG tablet    Sig: Take 1 tablet (0.5 mg total) by mouth at bedtime.    Dispense:  90 tablet    Refill:  1  . losartan (COZAAR) 50 MG tablet    Sig: Take 1 tablet (50 mg total) by mouth daily.    Dispense:  90 tablet    Refill:  1  . montelukast (SINGULAIR) 10 MG tablet    Sig: Take 1 tablet (10 mg total) by mouth daily.    Dispense:  90 tablet    Refill:  1  . diclofenac (VOLTAREN) 75 MG EC tablet    Sig: Take 1 tablet (75 mg total) by mouth 2 (two) times daily. For muscle and  Joint pain    Dispense:  180 tablet    Refill:  3  . venlafaxine  XR (EFFEXOR-XR) 75 MG 24 hr capsule    Sig: Take 1 capsule (75 mg total) by mouth daily.    Dispense:  90 capsule    Refill:  1  . omeprazole-sodium bicarbonate (ZEGERID) 40-1100 MG capsule    Sig: TAKE 1 CAPSULE EVERY MORNING BEFORE BREAKFAST    Dispense:  90 capsule    Refill:  1       Follow-up: Return in about 6 months (around 11/15/2021).  Mechele Claude, M.D.

## 2021-05-22 NOTE — Progress Notes (Signed)
CALLED PATIENT, BUSY SIGNAL X 2 

## 2021-05-23 DIAGNOSIS — M79672 Pain in left foot: Secondary | ICD-10-CM | POA: Diagnosis not present

## 2021-05-23 DIAGNOSIS — M25572 Pain in left ankle and joints of left foot: Secondary | ICD-10-CM | POA: Diagnosis not present

## 2021-07-01 ENCOUNTER — Other Ambulatory Visit: Payer: Self-pay | Admitting: Family Medicine

## 2021-07-03 ENCOUNTER — Telehealth: Payer: Self-pay | Admitting: *Deleted

## 2021-07-03 ENCOUNTER — Ambulatory Visit (INDEPENDENT_AMBULATORY_CARE_PROVIDER_SITE_OTHER): Payer: Medicare Other | Admitting: Family Medicine

## 2021-07-03 ENCOUNTER — Encounter: Payer: Self-pay | Admitting: Family Medicine

## 2021-07-03 ENCOUNTER — Other Ambulatory Visit: Payer: Self-pay

## 2021-07-03 VITALS — BP 161/83 | HR 86 | Temp 98.2°F | Ht 61.0 in | Wt 150.4 lb

## 2021-07-03 DIAGNOSIS — M810 Age-related osteoporosis without current pathological fracture: Secondary | ICD-10-CM

## 2021-07-03 DIAGNOSIS — F331 Major depressive disorder, recurrent, moderate: Secondary | ICD-10-CM

## 2021-07-03 DIAGNOSIS — G4489 Other headache syndrome: Secondary | ICD-10-CM | POA: Diagnosis not present

## 2021-07-03 DIAGNOSIS — R600 Localized edema: Secondary | ICD-10-CM | POA: Diagnosis not present

## 2021-07-03 MED ORDER — DULOXETINE HCL 60 MG PO CPEP
60.0000 mg | ORAL_CAPSULE | Freq: Every day | ORAL | 5 refills | Status: DC
Start: 2021-07-03 — End: 2021-11-15

## 2021-07-03 MED ORDER — LOSARTAN POTASSIUM-HCTZ 50-12.5 MG PO TABS: 1.0000 | ORAL_TABLET | Freq: Every day | ORAL | 1 refills | Status: DC

## 2021-07-03 NOTE — Progress Notes (Signed)
Subjective:  Patient ID: Lauren Stokes, female    DOB: 12-08-1946  Age: 75 y.o. MRN: [id number]  CC: Edema (Bilateral lower extremity/) and Headache   HPI Lauren Stokes presents for swelling in the feet. Onset after being taken off of her fluid pill.  Frontal and occipital pain. Onset after last office visit about 3 weeks ago. Hurts ot touch forehead. Not real bad. Annoying. 4/10. Aches.  Depression screen [facility] 2/9 07/03/2021 05/16/2021 05/16/2021  Decreased Interest 0 0 0  Down, Depressed, Hopeless 0 0 0  PHQ - 2 Score 0 0 0  Altered sleeping - 0 -  Tired, decreased energy - 1 -  Feeling bad or failure about yourself  - 0 -  Trouble concentrating - 0 -  Moving slowly or fidgety/restless - 0 -  Suicidal thoughts - 0 -  PHQ-9 Score - 1 -  Difficult doing work/chores - Not difficult at all -    History [name] has a past medical history of Allergy, Collagen vascular disease (HCC), Depression, Essential tremor, GERD (gastroesophageal reflux disease), and Hypertension.   She has a past surgical history that includes Cosmetic surgery and Cholecystectomy.   Her family history includes Aneurysm in her mother; Anxiety disorder in her mother; Cancer in her father; Hearing loss in her sister; Pneumonia in her mother; Ulcers in her mother.She reports that she has never smoked. She has never used smokeless tobacco. She reports that she does not drink alcohol and does not use drugs.    ROS Review of Systems  Objective:  BP (!) 161/83   Pulse 86   Temp 98.2 F (36.8 C)   Ht 5\' 1"  (1.549 m)   Wt 150 lb 6.4 oz (68.2 kg)   SpO2 95%   BMI 28.42 kg/m   BP Readings from Last 3 Encounters:  07/03/21 (!) 161/83  05/16/21 (!) 91/57  01/04/21 110/74    Wt Readings from Last 3 Encounters:  07/03/21 150 lb 6.4 oz (68.2 kg)  05/16/21 151 lb (68.5 kg)  01/04/21 148 lb (67.1 kg)     Physical Exam    Assessment & Plan:   Lauren Stokes was seen today for edema and headache.  Diagnoses and  all orders for this visit:  Localized edema  Moderate episode of recurrent major depressive disorder (HCC)  Other headache syndrome  Other orders -     losartan-hydrochlorothiazide (HYZAAR) 50-12.5 MG tablet; Take 1 tablet by mouth daily. -     DULoxetine (CYMBALTA) 60 MG capsule; Take 1 capsule (60 mg total) by mouth daily.      I have discontinued Amayrani D. Fryman's losartan and venlafaxine XR. I am also having her start on losartan-hydrochlorothiazide and DULoxetine. Additionally, I am having her maintain her calcium citrate-vitamin D, Cyanocobalamin (VITAMIN B 12 PO), acetaminophen, cetirizine, cholecalciferol, fluticasone, clotrimazole-betamethasone, LORazepam, Boswellia-Glucosamine-Vit D (OSTEO BI-FLEX ONE PER DAY PO), montelukast, diclofenac, omeprazole-sodium bicarbonate, and alendronate.  Allergies as of 07/03/2021       Reactions   Cefaclor Nausea And Vomiting   Penicillins Rash   Tetracyclines & Related Rash   vomiting        Medication List        Accurate as of July 03, 2021  3:36 PM. If you have any questions, ask your nurse or doctor.          STOP taking these medications    losartan 50 MG tablet Commonly known as: COZAAR Stopped by: July 05, 2021, MD   venlafaxine XR 75  MG 24 hr capsule Commonly known as: EFFEXOR-XR Stopped by: Mechele Claude, MD       TAKE these medications    acetaminophen 650 MG CR tablet Commonly known as: TYLENOL Take 650 mg by mouth every 8 (eight) hours as needed for pain.   alendronate 70 MG tablet Commonly known as: FOSAMAX TAKE 1 TABLET WEEKLY (TAKE WITH 8OZ OF WATER 30 MINUTES BEFORE BREAKFAST)   calcium citrate-vitamin D 315-200 MG-UNIT tablet Commonly known as: CITRACAL+D Take 1 tablet by mouth daily.   cetirizine 10 MG tablet Commonly known as: ZYRTEC Take 1 tablet (10 mg total) by mouth daily.   cholecalciferol 25 MCG (1000 UNIT) tablet Commonly known as: VITAMIN D3 Take 1 tablet (1,000 Units total)  by mouth daily.   clotrimazole-betamethasone cream Commonly known as: Lotrisone Apply 1 application topically 2 (two) times daily. To affected areas until rash clears   diclofenac 75 MG EC tablet Commonly known as: VOLTAREN Take 1 tablet (75 mg total) by mouth 2 (two) times daily. For muscle and  Joint pain   DULoxetine 60 MG capsule Commonly known as: Cymbalta Take 1 capsule (60 mg total) by mouth daily. Started by: Mechele Claude, MD   fluticasone 50 MCG/ACT nasal spray Commonly known as: FLONASE Place 1 spray into both nostrils daily.   LORazepam 0.5 MG tablet Commonly known as: ATIVAN Take 1 tablet (0.5 mg total) by mouth at bedtime.   losartan-hydrochlorothiazide 50-12.5 MG tablet Commonly known as: HYZAAR Take 1 tablet by mouth daily. Started by: Mechele Claude, MD   montelukast 10 MG tablet Commonly known as: SINGULAIR Take 1 tablet (10 mg total) by mouth daily.   omeprazole-sodium bicarbonate 40-1100 MG capsule Commonly known as: ZEGERID TAKE 1 CAPSULE EVERY MORNING BEFORE BREAKFAST   OSTEO BI-FLEX ONE PER DAY PO Take by mouth.   VITAMIN B 12 PO Take 1 tablet by mouth daily.         Follow-up: Return in about 1 month (around 08/03/2021).  Mechele Claude, M.D.

## 2021-07-03 NOTE — Telephone Encounter (Signed)
Alendronate Sodium 70MG  tablets PA started  Key: Sent to plan

## 2021-07-04 NOTE — Telephone Encounter (Signed)
PA not needed per [facility] - filled med

## 2021-07-26 ENCOUNTER — Other Ambulatory Visit: Payer: Medicare Other

## 2021-07-26 ENCOUNTER — Other Ambulatory Visit: Payer: Self-pay

## 2021-08-03 ENCOUNTER — Ambulatory Visit: Payer: Medicare Other | Admitting: Family Medicine

## 2021-08-08 ENCOUNTER — Other Ambulatory Visit: Payer: Self-pay | Admitting: Family Medicine

## 2021-08-10 ENCOUNTER — Other Ambulatory Visit: Payer: Self-pay

## 2021-08-10 ENCOUNTER — Encounter: Payer: Self-pay | Admitting: Family Medicine

## 2021-08-10 ENCOUNTER — Ambulatory Visit (INDEPENDENT_AMBULATORY_CARE_PROVIDER_SITE_OTHER): Payer: Medicare Other | Admitting: Family Medicine

## 2021-08-10 VITALS — BP 115/75 | HR 86 | Temp 97.6°F | Resp 20 | Ht 61.0 in | Wt 149.0 lb

## 2021-08-10 DIAGNOSIS — Z1211 Encounter for screening for malignant neoplasm of colon: Secondary | ICD-10-CM | POA: Diagnosis not present

## 2021-08-10 DIAGNOSIS — K2100 Gastro-esophageal reflux disease with esophagitis, without bleeding: Secondary | ICD-10-CM

## 2021-08-10 MED ORDER — OMEPRAZOLE-SODIUM BICARBONATE 40-1100 MG PO CAPS: ORAL_CAPSULE | ORAL | 1 refills | Status: DC
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
## 2021-08-14 ENCOUNTER — Encounter: Payer: Self-pay | Admitting: Family Medicine

## 2021-08-15 ENCOUNTER — Telehealth: Payer: Self-pay | Admitting: *Deleted

## 2021-08-15 DIAGNOSIS — K2100 Gastro-esophageal reflux disease with esophagitis, without bleeding: Secondary | ICD-10-CM

## 2021-08-15 NOTE — Telephone Encounter (Signed)
Key: [id number] Omeprazole-Sodium Bicarbonate 40-1100MG  capsules PA started Sent to plan

## 2021-08-15 NOTE — Telephone Encounter (Signed)
Approved today Effective from 08/15/2021 through 08/15/2022

## 2021-08-17 ENCOUNTER — Encounter (INDEPENDENT_AMBULATORY_CARE_PROVIDER_SITE_OTHER): Payer: Self-pay | Admitting: *Deleted

## 2021-08-29 DIAGNOSIS — M79676 Pain in unspecified toe(s): Secondary | ICD-10-CM | POA: Diagnosis not present

## 2021-08-29 DIAGNOSIS — B351 Tinea unguium: Secondary | ICD-10-CM | POA: Diagnosis not present

## 2021-09-23 ENCOUNTER — Other Ambulatory Visit: Payer: Self-pay | Admitting: Family Medicine

## 2021-09-25 ENCOUNTER — Ambulatory Visit (INDEPENDENT_AMBULATORY_CARE_PROVIDER_SITE_OTHER): Payer: Medicare Other | Admitting: *Deleted

## 2021-09-25 DIAGNOSIS — Z23 Encounter for immunization: Secondary | ICD-10-CM | POA: Diagnosis not present

## 2021-10-06 ENCOUNTER — Telehealth: Payer: Self-pay | Admitting: Family Medicine

## 2021-10-06 NOTE — Telephone Encounter (Signed)
  No answer unable to leave a message for patient to call back and schedule Medicare Annual Wellness Visit (AWV) to be completed by video or phone.  No hx of AWV eligible for AWVI as of  12/10/2009 per [employer]  Please schedule at anytime with [facility]-Nurse Health Advisor.      45 Minutes appointment   Any questions, please call me at [phone]   

## 2021-11-06 ENCOUNTER — Ambulatory Visit: Payer: Medicare Other

## 2021-11-13 ENCOUNTER — Other Ambulatory Visit: Payer: Medicare Other

## 2021-11-13 ENCOUNTER — Other Ambulatory Visit: Payer: Self-pay | Admitting: *Deleted

## 2021-11-13 ENCOUNTER — Other Ambulatory Visit: Payer: Self-pay | Admitting: Family Medicine

## 2021-11-13 DIAGNOSIS — I10 Essential (primary) hypertension: Secondary | ICD-10-CM

## 2021-11-13 DIAGNOSIS — Z13220 Encounter for screening for lipoid disorders: Secondary | ICD-10-CM | POA: Diagnosis not present

## 2021-11-13 LAB — CBC WITH DIFFERENTIAL/PLATELET
Basophils Absolute: 0 10*3/uL (ref 0.0–0.2)
Basos: 1 %
EOS (ABSOLUTE): 0.2 10*3/uL (ref 0.0–0.4)
Eos: 3 %
Hematocrit: 36.7 % (ref 34.0–46.6)
Hemoglobin: 12.5 g/dL (ref 11.1–15.9)
Immature Grans (Abs): 0 10*3/uL (ref 0.0–0.1)
Immature Granulocytes: 0 %
Lymphocytes Absolute: 2.3 10*3/uL (ref 0.7–3.1)
Lymphs: 29 %
MCH: 30.9 pg (ref 26.6–33.0)
MCHC: 34.1 g/dL (ref 31.5–35.7)
MCV: 91 fL (ref 79–97)
Monocytes Absolute: 0.8 10*3/uL (ref 0.1–0.9)
Monocytes: 10 %
Neutrophils Absolute: 4.4 10*3/uL (ref 1.4–7.0)
Neutrophils: 57 %
Platelets: 314 10*3/uL (ref 150–450)
RBC: 4.05 x10E6/uL (ref 3.77–5.28)
RDW: 12.2 % (ref 11.7–15.4)
WBC: 7.7 10*3/uL (ref 3.4–10.8)

## 2021-11-13 LAB — CMP14+EGFR
ALT: 39 IU/L — ABNORMAL HIGH (ref 0–32)
AST: 32 IU/L (ref 0–40)
Albumin/Globulin Ratio: 1.8 (ref 1.2–2.2)
Albumin: 4.4 g/dL (ref 3.7–4.7)
Alkaline Phosphatase: 61 IU/L (ref 44–121)
BUN/Creatinine Ratio: 32 — ABNORMAL HIGH (ref 12–28)
BUN: 23 mg/dL (ref 8–27)
Bilirubin Total: 0.3 mg/dL (ref 0.0–1.2)
CO2: 24 mmol/L (ref 20–29)
Calcium: 9.1 mg/dL (ref 8.7–10.3)
Chloride: 100 mmol/L (ref 96–106)
Creatinine, Ser: 0.71 mg/dL (ref 0.57–1.00)
Globulin, Total: 2.4 g/dL (ref 1.5–4.5)
Glucose: 110 mg/dL — ABNORMAL HIGH (ref 70–99)
Potassium: 4.3 mmol/L (ref 3.5–5.2)
Sodium: 139 mmol/L (ref 134–144)
Total Protein: 6.8 g/dL (ref 6.0–8.5)
eGFR: 89 mL/min/{1.73_m2} (ref >59)

## 2021-11-13 LAB — LIPID PANEL
Chol/HDL Ratio: 2.3 ratio (ref 0.0–4.4)
Cholesterol, Total: 131 mg/dL (ref 100–199)
HDL: 58 mg/dL (ref >39)
LDL Chol Calc (NIH): 59 mg/dL (ref 0–99)
Triglycerides: 67 mg/dL (ref 0–149)
VLDL Cholesterol Cal: 14 mg/dL (ref 5–40)

## 2021-11-15 ENCOUNTER — Ambulatory Visit (INDEPENDENT_AMBULATORY_CARE_PROVIDER_SITE_OTHER): Payer: Medicare Other | Admitting: Family Medicine

## 2021-11-15 ENCOUNTER — Encounter: Payer: Self-pay | Admitting: Family Medicine

## 2021-11-15 ENCOUNTER — Ambulatory Visit (INDEPENDENT_AMBULATORY_CARE_PROVIDER_SITE_OTHER): Payer: Medicare Other

## 2021-11-15 VITALS — BP 126/71 | HR 100 | Temp 98.1°F | Ht 61.0 in | Wt 149.0 lb

## 2021-11-15 DIAGNOSIS — F1320 Sedative, hypnotic or anxiolytic dependence, uncomplicated: Secondary | ICD-10-CM

## 2021-11-15 DIAGNOSIS — M5137 Other intervertebral disc degeneration, lumbosacral region: Secondary | ICD-10-CM | POA: Diagnosis not present

## 2021-11-15 DIAGNOSIS — M25551 Pain in right hip: Secondary | ICD-10-CM

## 2021-11-15 DIAGNOSIS — M25552 Pain in left hip: Secondary | ICD-10-CM | POA: Diagnosis not present

## 2021-11-15 DIAGNOSIS — J3089 Other allergic rhinitis: Secondary | ICD-10-CM

## 2021-11-15 DIAGNOSIS — M1711 Unilateral primary osteoarthritis, right knee: Secondary | ICD-10-CM | POA: Diagnosis not present

## 2021-11-15 DIAGNOSIS — E559 Vitamin D deficiency, unspecified: Secondary | ICD-10-CM

## 2021-11-15 DIAGNOSIS — M1611 Unilateral primary osteoarthritis, right hip: Secondary | ICD-10-CM | POA: Diagnosis not present

## 2021-11-15 DIAGNOSIS — M545 Low back pain, unspecified: Secondary | ICD-10-CM

## 2021-11-15 DIAGNOSIS — K2100 Gastro-esophageal reflux disease with esophagitis, without bleeding: Secondary | ICD-10-CM

## 2021-11-15 DIAGNOSIS — M1710 Unilateral primary osteoarthritis, unspecified knee: Secondary | ICD-10-CM

## 2021-11-15 DIAGNOSIS — I10 Essential (primary) hypertension: Secondary | ICD-10-CM

## 2021-11-15 DIAGNOSIS — F411 Generalized anxiety disorder: Secondary | ICD-10-CM

## 2021-11-15 MED ORDER — ALENDRONATE SODIUM 70 MG PO TABS: ORAL_TABLET | ORAL | 2 refills | Status: DC

## 2021-11-15 MED ORDER — VITAMIN D 25 MCG (1000 UNIT) PO TABS: 1000.0000 [IU] | ORAL_TABLET | Freq: Every day | ORAL | 2 refills | Status: AC

## 2021-11-15 MED ORDER — MONTELUKAST SODIUM 10 MG PO TABS: 10.0000 mg | ORAL_TABLET | Freq: Every day | ORAL | 3 refills | Status: DC

## 2021-11-15 MED ORDER — DULOXETINE HCL 60 MG PO CPEP: 60.0000 mg | ORAL_CAPSULE | Freq: Every day | ORAL | 3 refills | Status: DC

## 2021-11-15 MED ORDER — PREDNISONE 10 MG PO TABS
ORAL_TABLET | ORAL | 0 refills | Status: DC
Start: 2021-11-15 — End: 2021-12-18

## 2021-11-15 MED ORDER — CETIRIZINE HCL 10 MG PO TABS
10.0000 mg | ORAL_TABLET | Freq: Every day | ORAL | 2 refills | Status: DC
Start: 2021-11-15 — End: 2022-09-26

## 2021-11-15 MED ORDER — LOSARTAN POTASSIUM-HCTZ 50-12.5 MG PO TABS: ORAL_TABLET | ORAL | 2 refills | Status: DC

## 2021-11-15 MED ORDER — LORAZEPAM 0.5 MG PO TABS
0.5000 mg | ORAL_TABLET | Freq: Every day | ORAL | 1 refills | Status: DC
Start: 2021-11-15 — End: 2022-09-26

## 2021-11-15 NOTE — Progress Notes (Signed)
Subjective:  Patient ID: Lauren Stokes, female    DOB: [birth year]  Age: 75 y.o. MRN: [id number]  CC: Medical Management of Chronic Issues   HPI Lauren Stokes presents for follow-up of her general anxiety disorder.  She is also treated for depression.  Based on interview and her PHQ and GAD 7 both appear to be stable.  She continues her medication as noted.  Pain at back, hips, knees is constant.  The diclofenac is not helping adequately.  Her mobility has been reduced significantly as well.  Patient in for follow-up of GERD. Currently asymptomatic taking  PPI daily. There is no chest pain or heartburn. No hematemesis and no melena. No dysphagia or choking. Onset is remote. Progression is stable. Complicating factors, none.  Follow-up of hypertension. Patient has no history of headache chest pain or shortness of breath or recent cough. Patient also denies symptoms of TIA such as numbness weakness lateralizing. Patient checks  blood pressure at home and has not had any elevated readings recently. Patient denies side effects from his medication. States taking it regularly.  Patient has allergic rhinitis symptoms including sneezing frequently sniffling, clear rhinorrhea, watery and itchy eyes. There has been no fever no chills no sweats. No earaches. There is some scratchy throat but no sore throat or difficulty swallowing. There is some nasal congestion.    Depression screen [facility] 2/9 11/15/2021 08/10/2021 07/03/2021  Decreased Interest 0 0 0  Down, Depressed, Hopeless 0 0 0  PHQ - 2 Score 0 0 0  Altered sleeping - 0 -  Tired, decreased energy - 0 -  Change in appetite - 0 -  Feeling bad or failure about yourself  - 0 -  Trouble concentrating - 0 -  Moving slowly or fidgety/restless - 0 -  Suicidal thoughts - 0 -  PHQ-9 Score - 0 -  Difficult doing work/chores - Not difficult at all -   GAD 7 : Generalized Anxiety Score 08/10/2021 05/16/2021 11/15/2020 05/12/2020  Nervous, Anxious, on Edge 0 0 0  0  Control/stop worrying 0 0 0 0  Worry too much - different things 0 1 0 0  Trouble relaxing 0 0 0 0  Restless 0 0 0 0  Easily annoyed or irritable 0 1 0 0  Afraid - awful might happen 0 0 0 0  Total GAD 7 Score 0 2 0 0  Anxiety Difficulty Not difficult at all Not difficult at all - Somewhat difficult     History Lauren Stokes has a past medical history of Allergy, Collagen vascular disease (HCC), Depression, Essential tremor, GERD (gastroesophageal reflux disease), and Hypertension.   She has a past surgical history that includes Cosmetic surgery and Cholecystectomy.   Her family history includes Aneurysm in her mother; Anxiety disorder in her mother; Cancer in her father; Hearing loss in her sister; Pneumonia in her mother; Ulcers in her mother.She reports that she has never smoked. She has never used smokeless tobacco. She reports that she does not drink alcohol and does not use drugs.    ROS Review of Systems  Constitutional:  Negative for activity change, appetite change, chills and fever.  HENT:  Positive for congestion, postnasal drip, rhinorrhea and sinus pressure. Negative for ear discharge, ear pain, hearing loss, nosebleeds, sneezing and trouble swallowing.   Respiratory:  Negative for chest tightness and shortness of breath.   Cardiovascular:  Negative for chest pain and palpitations.  Musculoskeletal:  Positive for arthralgias.  Skin:  Negative for rash.  Objective:  BP 126/71   Pulse 100   Temp 98.1 F (36.7 C)   Ht 5\' 1"  (1.549 m)   Wt 149 lb (67.6 kg)   SpO2 96%   BMI 28.15 kg/m   BP Readings from Last 3 Encounters:  11/15/21 126/71  08/10/21 115/75  07/03/21 (!) 161/83    Wt Readings from Last 3 Encounters:  11/15/21 149 lb (67.6 kg)  08/10/21 149 lb (67.6 kg)  07/03/21 150 lb 6.4 oz (68.2 kg)     Physical Exam Constitutional:      General: She is not in acute distress.    Appearance: She is well-developed.  HENT:     Head: Normocephalic and  atraumatic.  Eyes:     Conjunctiva/sclera: Conjunctivae normal.     Pupils: Pupils are equal, round, and reactive to light.  Neck:     Thyroid: No thyromegaly.  Cardiovascular:     Rate and Rhythm: Normal rate and regular rhythm.     Heart sounds: Normal heart sounds. No murmur heard. Pulmonary:     Effort: Pulmonary effort is normal. No respiratory distress.     Breath sounds: Normal breath sounds. No wheezing or rales.  Abdominal:     General: Bowel sounds are normal. There is no distension.     Palpations: Abdomen is soft.     Tenderness: There is no abdominal tenderness.  Musculoskeletal:        General: Tenderness (At both hips and knees.) present. Normal range of motion.     Cervical back: Normal range of motion and neck supple.     Comments: Walks with a significant limp  Lymphadenopathy:     Cervical: No cervical adenopathy.  Skin:    General: Skin is warm and dry.  Neurological:     Mental Status: She is alert and oriented to person, place, and time.  Psychiatric:        Behavior: Behavior normal.        Thought Content: Thought content normal.        Judgment: Judgment normal.      Assessment & Plan:   Lauren Stokes was seen today for medical management of chronic issues.  Diagnoses and all orders for this visit:  Gastroesophageal reflux disease with esophagitis, unspecified whether hemorrhage  Non-seasonal allergic rhinitis, unspecified trigger -     cetirizine (ZYRTEC) 10 MG tablet; Take 1 tablet (10 mg total) by mouth daily. -     montelukast (SINGULAIR) 10 MG tablet; Take 1 tablet (10 mg total) by mouth daily.  Vitamin D deficiency -     cholecalciferol (VITAMIN D3) 25 MCG (1000 UNIT) tablet; Take 1 tablet (1,000 Units total) by mouth daily.  GAD (generalized anxiety disorder) -     LORazepam (ATIVAN) 0.5 MG tablet; Take 1 tablet (0.5 mg total) by mouth at bedtime.  Benzodiazepine dependence (HCC) -     LORazepam (ATIVAN) 0.5 MG tablet; Take 1 tablet (0.5  mg total) by mouth at bedtime.  Essential hypertension  Arthritis of knee -     DG Knee 1-2 Views Right; Future  Hip pain, bilateral -     DG HIP UNILAT W OR W/O PELVIS 2-3 VIEWS LEFT; Future -     DG HIP UNILAT W OR W/O PELVIS 2-3 VIEWS RIGHT; Future  Lumbar pain -     DG Lumbar Spine 2-3 Views; Future  Other orders -     alendronate (FOSAMAX) 70 MG tablet; TAKE 1 TABLET WEEKLY (TAKE WITH 8OZ  OF WATER 30 MINUTES BEFORE BREAKFAST) -     DULoxetine (CYMBALTA) 60 MG capsule; Take 1 capsule (60 mg total) by mouth daily. -     losartan-hydrochlorothiazide (HYZAAR) 50-12.5 MG tablet; TAKE ONE (1) TABLET BY MOUTH EVERY DAY -     predniSONE (DELTASONE) 10 MG tablet; Take 5 daily for 3 days followed by 4,3,2 and 1 for 3 days each.      I am having Lauren Stokes start on predniSONE. I am also having her maintain her calcium citrate-vitamin D, Cyanocobalamin (VITAMIN B 12 PO), acetaminophen, fluticasone, clotrimazole-betamethasone, Boswellia-Glucosamine-Vit D (OSTEO BI-FLEX ONE PER DAY PO), diclofenac, omeprazole-sodium bicarbonate, alendronate, cetirizine, cholecalciferol, DULoxetine, montelukast, losartan-hydrochlorothiazide, and LORazepam.  Allergies as of 11/15/2021       Reactions   Cefaclor Nausea And Vomiting   Penicillins Rash   Tetracyclines & Related Rash   vomiting        Medication List        Accurate as of November 15, 2021  9:00 PM. If you have any questions, ask your nurse or doctor.          acetaminophen 650 MG CR tablet Commonly known as: TYLENOL Take 650 mg by mouth every 8 (eight) hours as needed for pain.   alendronate 70 MG tablet Commonly known as: FOSAMAX TAKE 1 TABLET WEEKLY (TAKE WITH 8OZ OF WATER 30 MINUTES BEFORE BREAKFAST)   calcium citrate-vitamin D 315-200 MG-UNIT tablet Commonly known as: CITRACAL+D Take 1 tablet by mouth daily.   cetirizine 10 MG tablet Commonly known as: ZYRTEC Take 1 tablet (10 mg total) by mouth daily.    cholecalciferol 25 MCG (1000 UNIT) tablet Commonly known as: VITAMIN D3 Take 1 tablet (1,000 Units total) by mouth daily.   clotrimazole-betamethasone cream Commonly known as: Lotrisone Apply 1 application topically 2 (two) times daily. To affected areas until rash clears   diclofenac 75 MG EC tablet Commonly known as: VOLTAREN Take 1 tablet (75 mg total) by mouth 2 (two) times daily. For muscle and  Joint pain   DULoxetine 60 MG capsule Commonly known as: Cymbalta Take 1 capsule (60 mg total) by mouth daily.   fluticasone 50 MCG/ACT nasal spray Commonly known as: FLONASE Place 1 spray into both nostrils daily.   LORazepam 0.5 MG tablet Commonly known as: ATIVAN Take 1 tablet (0.5 mg total) by mouth at bedtime.   losartan-hydrochlorothiazide 50-12.5 MG tablet Commonly known as: HYZAAR TAKE ONE (1) TABLET BY MOUTH EVERY DAY What changed:  how to take this when to take this additional instructions Changed by: Mechele Claude, MD   montelukast 10 MG tablet Commonly known as: SINGULAIR Take 1 tablet (10 mg total) by mouth daily.   omeprazole-sodium bicarbonate 40-1100 MG capsule Commonly known as: ZEGERID TAKE 1 CAPSULE EVERY MORNING BEFORE BREAKFAST   OSTEO BI-FLEX ONE PER DAY PO Take by mouth.   predniSONE 10 MG tablet Commonly known as: DELTASONE Take 5 daily for 3 days followed by 4,3,2 and 1 for 3 days each. Started by: Mechele Claude, MD   VITAMIN B 12 PO Take 1 tablet by mouth daily.         Follow-up: No follow-ups on file.  Mechele Claude, M.D.

## 2021-11-28 DIAGNOSIS — M79676 Pain in unspecified toe(s): Secondary | ICD-10-CM | POA: Diagnosis not present

## 2021-11-28 DIAGNOSIS — B351 Tinea unguium: Secondary | ICD-10-CM | POA: Diagnosis not present

## 2021-12-18 ENCOUNTER — Ambulatory Visit (INDEPENDENT_AMBULATORY_CARE_PROVIDER_SITE_OTHER): Payer: Medicare Other | Admitting: Orthopedic Surgery

## 2021-12-18 ENCOUNTER — Encounter: Payer: Self-pay | Admitting: Orthopedic Surgery

## 2021-12-18 VITALS — BP 134/68 | HR 93 | Ht 61.0 in | Wt 150.0 lb

## 2021-12-18 DIAGNOSIS — M170 Bilateral primary osteoarthritis of knee: Secondary | ICD-10-CM

## 2021-12-18 DIAGNOSIS — M5442 Lumbago with sciatica, left side: Secondary | ICD-10-CM

## 2021-12-18 DIAGNOSIS — G8929 Other chronic pain: Secondary | ICD-10-CM

## 2021-12-18 DIAGNOSIS — M5441 Lumbago with sciatica, right side: Secondary | ICD-10-CM | POA: Diagnosis not present

## 2021-12-18 NOTE — Progress Notes (Signed)
New Patient Visit  Assessment: Lauren Stokes is a 76 y.o. female with the following: 1. Chronic bilateral low back pain with bilateral sciatica 2. Primary osteoarthritis of both knees  Plan: Patient has pain in her lower back, with radiating pains into bilateral buttocks.  Negative straight leg raise.  The pain does not radiate distally into her legs, or beyond her knee.  We discussed warning symptoms, should this pain and radiating issues progressed.  Continue with medications as needed.  In regards to her bilateral knee pain, left worse than right, it has improved following recent course of prednisone.  We did briefly discuss proceeding with an injection, but she is not interested at this time.  Should her pain progress, we are able to provide her with an injection.  She will contact the clinic if she wishes to proceed.   Follow-up: Return if symptoms worsen or fail to improve.  Subjective:  Chief Complaint  Patient presents with   Knee Pain    Bilateral  L>R   Hip Pain    Bilateral/ both hurt about the same    History of Present Illness: Lauren Stokes is a 76 y.o. female who has been referred to clinic today by Storm Frisk, MD for evaluation of bilateral hip and knee pain.  She states she has pain in the lower back, which radiates into bilateral buttocks.  Regards to her knees, left is worse than right.  She just finished a course of prednisone, and states that her pain is much better.  The pain is in the lateral aspect of her left knee.  She also notes swelling and pain in the posterior aspect of her left knee.  No history of injuries to her back or her knees.  She has no pain over the lateral hips.   Review of Systems: No fevers or chills No numbness or tingling No chest pain No shortness of breath No bowel or bladder dysfunction No GI distress No headaches   Medical History:  Past Medical History:  Diagnosis Date   Allergy    Collagen vascular disease (HCC)     Depression    Essential tremor    GERD (gastroesophageal reflux disease)    Hypertension     Past Surgical History:  Procedure Laterality Date   CHOLECYSTECTOMY     COSMETIC SURGERY     facial  (born deformed)     Family History  Problem Relation Age of Onset   Anxiety disorder Mother    Aneurysm Mother    Pneumonia Mother    Ulcers Mother    Cancer Father        brain tumor   Hearing loss Sister    Social History   Tobacco Use   Smoking status: Never   Smokeless tobacco: Never  Vaping Use   Vaping Use: Never used  Substance Use Topics   Alcohol use: Never   Drug use: Never    Allergies  Allergen Reactions   Cefaclor Nausea And Vomiting   Penicillins Rash   Tetracyclines & Related Rash    vomiting    Current Meds  Medication Sig   acetaminophen (TYLENOL) 650 MG CR tablet Take 650 mg by mouth every 8 (eight) hours as needed for pain.   alendronate (FOSAMAX) 70 MG tablet TAKE 1 TABLET WEEKLY (TAKE WITH 8OZ OF WATER 30 MINUTES BEFORE BREAKFAST)   Boswellia-Glucosamine-Vit D (OSTEO BI-FLEX ONE PER DAY PO) Take by mouth.   calcium citrate-vitamin D (CITRACAL+D) 315-200 MG-UNIT  tablet Take 1 tablet by mouth daily.   cetirizine (ZYRTEC) 10 MG tablet Take 1 tablet (10 mg total) by mouth daily.   cholecalciferol (VITAMIN D3) 25 MCG (1000 UNIT) tablet Take 1 tablet (1,000 Units total) by mouth daily.   clotrimazole-betamethasone (LOTRISONE) cream Apply 1 application topically 2 (two) times daily. To affected areas until rash clears   Cyanocobalamin (VITAMIN B 12 PO) Take 1 tablet by mouth daily.   diclofenac (VOLTAREN) 75 MG EC tablet Take 1 tablet (75 mg total) by mouth 2 (two) times daily. For muscle and  Joint pain   DULoxetine (CYMBALTA) 60 MG capsule Take 1 capsule (60 mg total) by mouth daily.   fluticasone (FLONASE) 50 MCG/ACT nasal spray Place 1 spray into both nostrils daily.   LORazepam (ATIVAN) 0.5 MG tablet Take 1 tablet (0.5 mg total) by mouth at bedtime.    losartan-hydrochlorothiazide (HYZAAR) 50-12.5 MG tablet TAKE ONE (1) TABLET BY MOUTH EVERY DAY   montelukast (SINGULAIR) 10 MG tablet Take 1 tablet (10 mg total) by mouth daily.   omeprazole-sodium bicarbonate (ZEGERID) 40-1100 MG capsule TAKE 1 CAPSULE EVERY MORNING BEFORE BREAKFAST    Objective: BP 134/68    Pulse 93    Ht 5\' 1"  (1.549 m)    Wt 150 lb (68 kg)    BMI 28.34 kg/m   Physical Exam:  General: Elderly female., Alert and oriented., and No acute distress. Gait: Slow, steady gait.  Tenderness palpation over the lower back, bilaterally.  Negative straight leg raise bilaterally.  Painless internal and external rotation of bilateral hips.  She is able to achieve full extension.  Valgus deformity bilateral knees.  Tenderness to palpation over the lateral aspect of the left knee.  She has a Baker's cyst, which is tender to palpation within the left knee.  Minimal tenderness to palpation along the medial lateral joint lines of the right knee.  No fullness in the posterior aspect of her knee.  IMAGING: I personally reviewed images previously obtained in clinic  X-rays of bilateral hips were obtained in clinic previously.  Minimal degenerative changes.  Well-maintained joint space.  Small osteophytes projecting off the acetabulum.  X-rays of bilateral knees were previously obtained in clinic.  Valgus deformity overall.  Mild loss of joint space.  Osteophytes are present.  X-rays of the lumbar spine were previously obtained.  Mild degenerative changes are noted.  No obvious anterolisthesis.   New Medications:  No orders of the defined types were placed in this encounter.     , MD  12/18/2021 2:37 PM

## 2021-12-29 ENCOUNTER — Telehealth: Payer: Self-pay | Admitting: Family Medicine

## 2021-12-29 NOTE — Telephone Encounter (Signed)
°  No answer unable to leave a message for patient to call back and schedule Medicare Annual Wellness Visit (AWV) to be completed by video or phone. ° °No hx of AWV eligible for AWVI as of 12/10/2009 per [facility]  ° °Please schedule at anytime with [facility] Nurse Health Advisor --- Mary Jane ° °45 Minutes appointment  ° °Any questions, please call me at [phone]   °

## 2022-01-15 DIAGNOSIS — H2513 Age-related nuclear cataract, bilateral: Secondary | ICD-10-CM | POA: Diagnosis not present

## 2022-01-15 DIAGNOSIS — H524 Presbyopia: Secondary | ICD-10-CM | POA: Diagnosis not present

## 2022-01-15 DIAGNOSIS — H40033 Anatomical narrow angle, bilateral: Secondary | ICD-10-CM | POA: Diagnosis not present

## 2022-02-07 ENCOUNTER — Ambulatory Visit (INDEPENDENT_AMBULATORY_CARE_PROVIDER_SITE_OTHER): Payer: Medicare Other

## 2022-02-07 VITALS — Ht 61.0 in | Wt 150.0 lb

## 2022-02-07 DIAGNOSIS — Z0000 Encounter for general adult medical examination without abnormal findings: Secondary | ICD-10-CM | POA: Diagnosis not present

## 2022-02-07 NOTE — Progress Notes (Signed)
Subjective:   Lauren Stokes is a 76 y.o. female who presents for an Initial Medicare Annual Wellness Visit. Virtual Visit via Telephone Note  I connected with  Lauren Stokes on 02/07/22 at 11:15 AM EST by telephone and verified that I am speaking with the correct person using two identifiers.  Location: Patient: HOME Provider: [facility] Persons participating in the virtual visit: patient/Nurse Health Advisor   I discussed the limitations, risks, security and privacy concerns of performing an evaluation and management service by telephone and the availability of in person appointments. The patient expressed understanding and agreed to proceed.  Interactive audio and video telecommunications were attempted between this nurse and patient, however failed, due to patient having technical difficulties OR patient did not have access to video capability.  We continued and completed visit with audio only.  Some vital signs may be absent or patient reported.   Lauren Dash, LPN  Review of Systems     Cardiac Risk Factors include: advanced age (>37men, >41 women);hypertension;dyslipidemia;sedentary lifestyle;Other (see comment), Risk factor comments: DDD     Objective:    Today's Vitals   02/07/22 1116  Weight: 150 lb (68 kg)  Height: 5\' 1"  (1.549 m)   Body mass index is 28.34 kg/m.  Advanced Directives 02/07/2022  Does Patient Have a Medical Advance Directive? No  Would patient like information on creating a medical advance directive? No - Patient declined    Current Medications (verified) Outpatient Encounter Medications as of 02/07/2022  Medication Sig   acetaminophen (TYLENOL) 650 MG CR tablet Take 650 mg by mouth every 8 (eight) hours as needed for pain.   alendronate (FOSAMAX) 70 MG tablet TAKE 1 TABLET WEEKLY (TAKE WITH 8OZ OF WATER 30 MINUTES BEFORE BREAKFAST)   Boswellia-Glucosamine-Vit D (OSTEO BI-FLEX ONE PER DAY PO) Take by mouth.   calcium citrate-vitamin D  (CITRACAL+D) 315-200 MG-UNIT tablet Take 1 tablet by mouth daily.   cetirizine (ZYRTEC) 10 MG tablet Take 1 tablet (10 mg total) by mouth daily.   cholecalciferol (VITAMIN D3) 25 MCG (1000 UNIT) tablet Take 1 tablet (1,000 Units total) by mouth daily.   clotrimazole-betamethasone (LOTRISONE) cream Apply 1 application topically 2 (two) times daily. To affected areas until rash clears   Cyanocobalamin (VITAMIN B 12 PO) Take 1 tablet by mouth daily.   diclofenac (VOLTAREN) 75 MG EC tablet Take 1 tablet (75 mg total) by mouth 2 (two) times daily. For muscle and  Joint pain   DULoxetine (CYMBALTA) 60 MG capsule Take 1 capsule (60 mg total) by mouth daily.   fluticasone (FLONASE) 50 MCG/ACT nasal spray Place 1 spray into both nostrils daily.   LORazepam (ATIVAN) 0.5 MG tablet Take 1 tablet (0.5 mg total) by mouth at bedtime.   losartan-hydrochlorothiazide (HYZAAR) 50-12.5 MG tablet TAKE ONE (1) TABLET BY MOUTH EVERY DAY   montelukast (SINGULAIR) 10 MG tablet Take 1 tablet (10 mg total) by mouth daily.   omeprazole-sodium bicarbonate (ZEGERID) 40-1100 MG capsule TAKE 1 CAPSULE EVERY MORNING BEFORE BREAKFAST   No facility-administered encounter medications on file as of 02/07/2022.    Allergies (verified) Cefaclor, Penicillins, and Tetracyclines & related   History: Past Medical History:  Diagnosis Date   Allergy    Collagen vascular disease (HCC)    Depression    Essential tremor    GERD (gastroesophageal reflux disease)    Hypertension    Past Surgical History:  Procedure Laterality Date   CHOLECYSTECTOMY     COSMETIC SURGERY  facial  (born deformed)    Family History  Problem Relation Age of Onset   Anxiety disorder Mother    Aneurysm Mother    Pneumonia Mother    Ulcers Mother    Cancer Father        brain tumor   Hearing loss Sister    Social History   Socioeconomic History   Marital status: Single    Spouse name: Not on file   Number of children: 0   Years of  education: Not on file   Highest education level: Not on file  Occupational History   Occupation: retired   Tobacco Use   Smoking status: Never   Smokeless tobacco: Never  Vaping Use   Vaping Use: Never used  Substance and Sexual Activity   Alcohol use: Never   Drug use: Never   Sexual activity: Not Currently  Other Topics Concern   Not on file  Social History Narrative   Lives with sister and brother-n-law    Social Determinants of Health   Financial Resource Strain: Low Risk    Difficulty of Paying Living Expenses: Not hard at all  Food Insecurity: No Food Insecurity   Worried About Programme researcher, broadcasting/film/videounning Out of Food in the Last Year: Never true   Ran Out of Food in the Last Year: Never true  Transportation Needs: No Transportation Needs   Lack of Transportation (Medical): No   Lack of Transportation (Non-Medical): No  Physical Activity: Insufficiently Active   Days of Exercise per Week: 3 days   Minutes of Exercise per Session: 20 min  Stress: No Stress Concern Present   Feeling of Stress : Not at all  Social Connections: Moderately Integrated   Frequency of Communication with Friends and Family: More than three times a week   Frequency of Social Gatherings with Friends and Family: More than three times a week   Attends Religious Services: More than 4 times per year   Active Member of [employer] or Organizations: Yes   Attends Engineer, structuralClub or Organization Meetings: More than 4 times per year   Marital Status: Never married    Tobacco Counseling Counseling given: Not Answered   Clinical Intake:  Pre-visit preparation completed: Yes  Pain : No/denies pain     BMI - recorded: 28.34 Nutritional Status: BMI 25 -29 Overweight Nutritional Risks: None Diabetes: No  How often do you need to have someone help you when you read instructions, pamphlets, or other written materials from your doctor or pharmacy?: 1 - Never  Diabetic?NO  Interpreter Needed?: No  Information entered by :: mj  Lauren Kruger, lpn   Activities of Daily Living In your present state of health, do you have any difficulty performing the following activities: 02/07/2022  Hearing? N  Vision? N  Difficulty concentrating or making decisions? Y  Comment At times.  Walking or climbing stairs? N  Dressing or bathing? N  Doing errands, shopping? N  Preparing Food and eating ? N  Using the Toilet? N  In the past six months, have you accidently leaked urine? N  Do you have problems with loss of bowel control? N  Managing your Medications? N  Managing your Finances? N  Housekeeping or managing your Housekeeping? N  Some recent data might be hidden    Patient Care Team: Mechele ClaudeStacks, Warren, MD as PCP - General (Family Medicine)  Indicate any recent Medical Services you may have received from other than [facility] providers in the past year (date may be approximate).  Assessment:   This is a routine wellness examination for Ron.  Hearing/Vision screen Hearing Screening - Comments:: No hearing issues.  Vision Screening - Comments:: Glasses. [place]. 01/2022  Dietary issues and exercise activities discussed: Current Exercise Habits: Home exercise routine, Type of exercise: walking, Time (Minutes): 20, Frequency (Times/Week): 3, Weekly Exercise (Minutes/Week): 60, Intensity: Mild, Exercise limited by: cardiac condition(s);orthopedic condition(s)   Goals Addressed             This Visit's Progress    Exercise 3x per week (30 min per time)       Encouraged chair exercises and stretching due to back issues.        Depression Screen PHQ 2/9 Scores 02/07/2022 11/15/2021 08/10/2021 07/03/2021 05/16/2021 05/16/2021 11/15/2020  PHQ - 2 Score 0 0 0 0 0 0 0  PHQ- 9 Score - - 0 - 1 - -    Fall Risk Fall Risk  02/07/2022 11/15/2021 11/15/2021 08/10/2021 07/03/2021  Falls in the past year? 0 0 0 0 0  Number falls in past yr: 0 - - - -  Injury with Fall? 0 - - - -  Risk for fall due to : Impaired balance/gait - - - -   Follow up Falls prevention discussed - - - -    FALL RISK PREVENTION PERTAINING TO THE HOME:  Any stairs in or around the home? Yes  If so, are there any without handrails? No  Home free of loose throw rugs in walkways, pet beds, electrical cords, etc? Yes  Adequate lighting in your home to reduce risk of falls? Yes   ASSISTIVE DEVICES UTILIZED TO PREVENT FALLS:  Life alert? No  Use of a cane, walker or w/c? No  Grab bars in the bathroom? Yes  Shower chair or bench in shower? No  Elevated toilet seat or a handicapped toilet? No   TIMED UP AND GO:  Was the test performed? No .  Phone visit.  Cognitive Function:     6CIT Screen 02/07/2022  What Year? 0 points  What month? 0 points  What time? 0 points  Count back from 20 0 points  Months in reverse 0 points  Repeat phrase 0 points  Total Score 0    Immunizations Immunization History  Administered Date(s) Administered   Fluad Quad(high Dose 65+) 09/07/2019, 09/16/2020, 09/25/2021   Influenza, High Dose Seasonal PF 10/04/2015, 09/12/2016, 09/26/2017, 09/23/2018   Influenza,trivalent, recombinat, inj, PF 09/20/2014   Moderna SARS-COV2 Booster Vaccination 11/23/2020   Moderna Sars-Covid-2 Vaccination 02/15/2020, 03/14/2020   Pneumococcal Conjugate-13 11/24/2015   Pneumococcal Polysaccharide-23 12/13/2016   Tdap 07/25/2015   Zoster Recombinat (Shingrix) 06/30/2018, 10/07/2018    TDAP status: Up to date  Flu Vaccine status: Up to date  Pneumococcal vaccine status: Up to date  Covid-19 vaccine status: Completed vaccines  Qualifies for Shingles Vaccine? Yes   Zostavax completed Yes   Shingrix Completed?: Yes  Screening Tests Health Maintenance  Topic Date Due   COVID-19 Vaccine (3 - Booster for Moderna series) 01/18/2021   MAMMOGRAM  07/13/2022   TETANUS/TDAP  07/24/2025   Pneumonia Vaccine 79+ Years old  Completed   INFLUENZA VACCINE  Completed   DEXA SCAN  Completed   Hepatitis C Screening  Completed    Zoster Vaccines- Shingrix  Completed   HPV VACCINES  Aged Out   COLONOSCOPY (Pts 45-48yrs Insurance coverage will need to be confirmed)  Discontinued    Health Maintenance  Health Maintenance Due  Topic Date Due  COVID-19 Vaccine (3 - Booster for Moderna series) 01/18/2021    Colorectal cancer screening: No longer required.   Mammogram status: Completed 07/13/2020. Repeat every year  Bone Density status: Completed 11/17/2020. Results reflect: Bone density results: OSTEOPOROSIS. Repeat every 2 years.  Lung Cancer Screening: (Low Dose CT Chest recommended if Age 34-80 years, 30 pack-year currently smoking OR have quit w/in 15years.) does not qualify.    Additional Screening:  Hepatitis C Screening: does qualify; Completed 01/13/20  Vision Screening: Recommended annual ophthalmology exams for early detection of glaucoma and other disorders of the eye. Is the patient up to date with their annual eye exam?  Yes  Who is the provider or what is the name of the office in which the patient attends annual eye exams? [facility] [place] If pt is not established with a provider, would they like to be referred to a provider to establish care? No .   Dental Screening: Recommended annual dental exams for proper oral hygiene  Community Resource Referral / Chronic Care Management: CRR required this visit?  No   CCM required this visit?  No      Plan:     I have personally reviewed and noted the following in the patients chart:   Medical and social history Use of alcohol, tobacco or illicit drugs  Current medications and supplements including opioid prescriptions. Patient is not currently taking opioid prescriptions. Functional ability and status Nutritional status Physical activity Advanced directives List of other physicians Hospitalizations, surgeries, and ER visits in previous 12 months Vitals Screenings to include cognitive, depression, and falls Referrals and appointments  In  addition, I have reviewed and discussed with patient certain preventive protocols, quality metrics, and best practice recommendations. A written personalized care plan for preventive services as well as general preventive health recommendations were provided to patient.     Lauren Dash, LPN   04/10/8412   Nurse Notes: Discussed Colonoscopy. Pt declined repeat. Pt states she will call to schedule a repeat Mammogram and DEXA. Pt is up to date on all vaccines.

## 2022-02-07 NOTE — Patient Instructions (Signed)
Lauren Stokes , Thank you for taking time to come for your Medicare Wellness Visit. I appreciate your ongoing commitment to your health goals. Please review the following plan we discussed and let me know if I can assist you in the future.   Screening recommendations/referrals: Colonoscopy: Declined repeat. Mammogram: Done 07/13/2020. Repeat annually  Bone Density: Done 11/17/2020 Repeat every 2 years  Recommended yearly ophthalmology/optometry visit for glaucoma screening and checkup Recommended yearly dental visit for hygiene and checkup  Vaccinations: Influenza vaccine: Done 09/15/2021 Repeat annually  Pneumococcal vaccine: Done 11/24/2015 and 12/13/2016 Tdap vaccine: Done 07/25/2015 Repeat in 10 years  Shingles vaccine: Done 06/30/2018 and 09/30/2018   Covid-19:Done 02/15/2020, 03/14/2020 and 11/23/2020  Advanced directives: Advance directive discussed with you today. Even though you declined this today, please call our office should you change your mind, and we can give you the proper paperwork for you to fill out.   Conditions/risks identified: Aim for 30 minutes of exercise or brisk walking, 6-8 glasses of water, and 5 servings of fruits and vegetables each day.   Next appointment: Follow up in one year for your annual wellness visit 2024.   Preventive Care 107 Years and Older, Female Preventive care refers to lifestyle choices and visits with your health care provider that can promote health and wellness. What does preventive care include? A yearly physical exam. This is also called an annual well check. Dental exams once or twice a year. Routine eye exams. Ask your health care provider how often you should have your eyes checked. Personal lifestyle choices, including: Daily care of your teeth and gums. Regular physical activity. Eating a healthy diet. Avoiding tobacco and drug use. Limiting alcohol use. Practicing safe sex. Taking low-dose aspirin every day. Taking vitamin and  mineral supplements as recommended by your health care provider. What happens during an annual well check? The services and screenings done by your health care provider during your annual well check will depend on your age, overall health, lifestyle risk factors, and family history of disease. Counseling  Your health care provider may ask you questions about your: Alcohol use. Tobacco use. Drug use. Emotional well-being. Home and relationship well-being. Sexual activity. Eating habits. History of falls. Memory and ability to understand (cognition). Work and work Astronomer. Reproductive health. Screening  You may have the following tests or measurements: Height, weight, and BMI. Blood pressure. Lipid and cholesterol levels. These may be checked every 5 years, or more frequently if you are over 37 years old. Skin check. Lung cancer screening. You may have this screening every year starting at age 30 if you have a 30-pack-year history of smoking and currently smoke or have quit within the past 15 years. Fecal occult blood test (FOBT) of the stool. You may have this test every year starting at age 71. Flexible sigmoidoscopy or colonoscopy. You may have a sigmoidoscopy every 5 years or a colonoscopy every 10 years starting at age 89. Hepatitis C blood test. Hepatitis B blood test. Sexually transmitted disease (STD) testing. Diabetes screening. This is done by checking your blood sugar (glucose) after you have not eaten for a while (fasting). You may have this done every 1-3 years. Bone density scan. This is done to screen for osteoporosis. You may have this done starting at age 17. Mammogram. This may be done every 1-2 years. Talk to your health care provider about how often you should have regular mammograms. Talk with your health care provider about your test results, treatment options, and  if necessary, the need for more tests. Vaccines  Your health care provider may recommend  certain vaccines, such as: Influenza vaccine. This is recommended every year. Tetanus, diphtheria, and acellular pertussis (Tdap, Td) vaccine. You may need a Td booster every 10 years. Zoster vaccine. You may need this after age 15. Pneumococcal 13-valent conjugate (PCV13) vaccine. One dose is recommended after age 65. Pneumococcal polysaccharide (PPSV23) vaccine. One dose is recommended after age 66. Talk to your health care provider about which screenings and vaccines you need and how often you need them. This information is not intended to replace advice given to you by your health care provider. Make sure you discuss any questions you have with your health care provider. Document Released: 12/23/2015 Document Revised: 08/15/2016 Document Reviewed: 09/27/2015 Elsevier Interactive Patient Education  2017 [employer].  Fall Prevention in the Home Falls can cause injuries. They can happen to people of all ages. There are many things you can do to make your home safe and to help prevent falls. What can I do on the outside of my home? Regularly fix the edges of walkways and driveways and fix any cracks. Remove anything that might make you trip as you walk through a door, such as a raised step or threshold. Trim any bushes or trees on the path to your home. Use bright outdoor lighting. Clear any walking paths of anything that might make someone trip, such as rocks or tools. Regularly check to see if handrails are loose or broken. Make sure that both sides of any steps have handrails. Any raised decks and porches should have guardrails on the edges. Have any leaves, snow, or ice cleared regularly. Use sand or salt on walking paths during winter. Clean up any spills in your garage right away. This includes oil or grease spills. What can I do in the bathroom? Use night lights. Install grab bars by the toilet and in the tub and shower. Do not use towel bars as grab bars. Use non-skid mats or  decals in the tub or shower. If you need to sit down in the shower, use a plastic, non-slip stool. Keep the floor dry. Clean up any water that spills on the floor as soon as it happens. Remove soap buildup in the tub or shower regularly. Attach bath mats securely with double-sided non-slip rug tape. Do not have throw rugs and other things on the floor that can make you trip. What can I do in the bedroom? Use night lights. Make sure that you have a light by your bed that is easy to reach. Do not use any sheets or blankets that are too big for your bed. They should not hang down onto the floor. Have a firm chair that has side arms. You can use this for support while you get dressed. Do not have throw rugs and other things on the floor that can make you trip. What can I do in the kitchen? Clean up any spills right away. Avoid walking on wet floors. Keep items that you use a lot in easy-to-reach places. If you need to reach something above you, use a strong step stool that has a grab bar. Keep electrical cords out of the way. Do not use floor polish or wax that makes floors slippery. If you must use wax, use non-skid floor wax. Do not have throw rugs and other things on the floor that can make you trip. What can I do with my stairs? Do not leave any  items on the stairs. Make sure that there are handrails on both sides of the stairs and use them. Fix handrails that are broken or loose. Make sure that handrails are as long as the stairways. Check any carpeting to make sure that it is firmly attached to the stairs. Fix any carpet that is loose or worn. Avoid having throw rugs at the top or bottom of the stairs. If you do have throw rugs, attach them to the floor with carpet tape. Make sure that you have a light switch at the top of the stairs and the bottom of the stairs. If you do not have them, ask someone to add them for you. What else can I do to help prevent falls? Wear shoes that: Do not  have high heels. Have rubber bottoms. Are comfortable and fit you well. Are closed at the toe. Do not wear sandals. If you use a stepladder: Make sure that it is fully opened. Do not climb a closed stepladder. Make sure that both sides of the stepladder are locked into place. Ask someone to hold it for you, if possible. Clearly mark and make sure that you can see: Any grab bars or handrails. First and last steps. Where the edge of each step is. Use tools that help you move around (mobility aids) if they are needed. These include: Canes. Walkers. Scooters. Crutches. Turn on the lights when you go into a dark area. Replace any light bulbs as soon as they burn out. Set up your furniture so you have a clear path. Avoid moving your furniture around. If any of your floors are uneven, fix them. If there are any pets around you, be aware of where they are. Review your medicines with your doctor. Some medicines can make you feel dizzy. This can increase your chance of falling. Ask your doctor what other things that you can do to help prevent falls. This information is not intended to replace advice given to you by your health care provider. Make sure you discuss any questions you have with your health care provider. Document Released: 09/22/2009 Document Revised: 05/03/2016 Document Reviewed: 12/31/2014 Elsevier Interactive Patient Education  2017 [employer].

## 2022-02-20 ENCOUNTER — Other Ambulatory Visit: Payer: Self-pay | Admitting: Family Medicine

## 2022-02-20 DIAGNOSIS — M1710 Unilateral primary osteoarthritis, unspecified knee: Secondary | ICD-10-CM

## 2022-04-27 ENCOUNTER — Other Ambulatory Visit: Payer: Self-pay | Admitting: Family Medicine

## 2022-04-27 DIAGNOSIS — K2100 Gastro-esophageal reflux disease with esophagitis, without bleeding: Secondary | ICD-10-CM

## 2022-05-14 ENCOUNTER — Other Ambulatory Visit: Payer: Self-pay | Admitting: *Deleted

## 2022-05-14 ENCOUNTER — Other Ambulatory Visit: Payer: Self-pay | Admitting: Family Medicine

## 2022-05-14 ENCOUNTER — Other Ambulatory Visit: Payer: Medicare Other

## 2022-05-14 DIAGNOSIS — Z13220 Encounter for screening for lipoid disorders: Secondary | ICD-10-CM | POA: Diagnosis not present

## 2022-05-14 DIAGNOSIS — K2100 Gastro-esophageal reflux disease with esophagitis, without bleeding: Secondary | ICD-10-CM

## 2022-05-14 DIAGNOSIS — I10 Essential (primary) hypertension: Secondary | ICD-10-CM | POA: Diagnosis not present

## 2022-05-15 LAB — LIPID PANEL
Chol/HDL Ratio: 2.3 ratio (ref 0.0–4.4)
Cholesterol, Total: 129 mg/dL (ref 100–199)
HDL: 55 mg/dL (ref >39)
LDL Chol Calc (NIH): 62 mg/dL (ref 0–99)
Triglycerides: 56 mg/dL (ref 0–149)
VLDL Cholesterol Cal: 12 mg/dL (ref 5–40)

## 2022-05-15 LAB — CMP14+EGFR
ALT: 38 IU/L — ABNORMAL HIGH (ref 0–32)
AST: 32 IU/L (ref 0–40)
Albumin/Globulin Ratio: 1.8 (ref 1.2–2.2)
Albumin: 4.4 g/dL (ref 3.7–4.7)
Alkaline Phosphatase: 65 IU/L (ref 44–121)
BUN/Creatinine Ratio: 35 — ABNORMAL HIGH (ref 12–28)
BUN: 24 mg/dL (ref 8–27)
Bilirubin Total: 0.4 mg/dL (ref 0.0–1.2)
CO2: 23 mmol/L (ref 20–29)
Calcium: 9.4 mg/dL (ref 8.7–10.3)
Chloride: 102 mmol/L (ref 96–106)
Creatinine, Ser: 0.69 mg/dL (ref 0.57–1.00)
Globulin, Total: 2.4 g/dL (ref 1.5–4.5)
Glucose: 115 mg/dL — ABNORMAL HIGH (ref 70–99)
Potassium: 3.8 mmol/L (ref 3.5–5.2)
Sodium: 141 mmol/L (ref 134–144)
Total Protein: 6.8 g/dL (ref 6.0–8.5)
eGFR: 91 mL/min/{1.73_m2} (ref >59)

## 2022-05-15 LAB — CBC WITH DIFFERENTIAL/PLATELET
Basophils Absolute: 0.1 10*3/uL (ref 0.0–0.2)
Basos: 1 %
EOS (ABSOLUTE): 0.3 10*3/uL (ref 0.0–0.4)
Eos: 4 %
Hematocrit: 38.7 % (ref 34.0–46.6)
Hemoglobin: 13 g/dL (ref 11.1–15.9)
Immature Grans (Abs): 0 10*3/uL (ref 0.0–0.1)
Immature Granulocytes: 0 %
Lymphocytes Absolute: 2.2 10*3/uL (ref 0.7–3.1)
Lymphs: 31 %
MCH: 31 pg (ref 26.6–33.0)
MCHC: 33.6 g/dL (ref 31.5–35.7)
MCV: 92 fL (ref 79–97)
Monocytes Absolute: 0.7 10*3/uL (ref 0.1–0.9)
Monocytes: 10 %
Neutrophils Absolute: 4 10*3/uL (ref 1.4–7.0)
Neutrophils: 54 %
Platelets: 260 10*3/uL (ref 150–450)
RBC: 4.19 x10E6/uL (ref 3.77–5.28)
RDW: 12.7 % (ref 11.7–15.4)
WBC: 7.3 10*3/uL (ref 3.4–10.8)

## 2022-05-16 ENCOUNTER — Ambulatory Visit (INDEPENDENT_AMBULATORY_CARE_PROVIDER_SITE_OTHER): Payer: Medicare Other | Admitting: Family Medicine

## 2022-05-16 ENCOUNTER — Encounter: Payer: Self-pay | Admitting: Family Medicine

## 2022-05-16 VITALS — BP 125/68 | HR 84 | Temp 98.1°F | Ht 61.0 in | Wt 150.8 lb

## 2022-05-16 DIAGNOSIS — F3342 Major depressive disorder, recurrent, in full remission: Secondary | ICD-10-CM | POA: Diagnosis not present

## 2022-05-16 DIAGNOSIS — I10 Essential (primary) hypertension: Secondary | ICD-10-CM

## 2022-05-16 DIAGNOSIS — F411 Generalized anxiety disorder: Secondary | ICD-10-CM | POA: Diagnosis not present

## 2022-05-16 DIAGNOSIS — F1320 Sedative, hypnotic or anxiolytic dependence, uncomplicated: Secondary | ICD-10-CM

## 2022-05-16 DIAGNOSIS — K2100 Gastro-esophageal reflux disease with esophagitis, without bleeding: Secondary | ICD-10-CM | POA: Diagnosis not present

## 2022-05-16 MED ORDER — OMEPRAZOLE-SODIUM BICARBONATE 40-1100 MG PO CAPS: ORAL_CAPSULE | ORAL | 1 refills | Status: DC

## 2022-05-16 MED ORDER — LOSARTAN POTASSIUM-HCTZ 50-12.5 MG PO TABS: ORAL_TABLET | ORAL | 2 refills | Status: DC

## 2022-05-16 NOTE — Progress Notes (Signed)
Subjective:  Patient ID: Lauren Stokes, female    DOB: 1946-04-08  Age: 76 y.o. MRN: [id number]  CC: Medical Management of Chronic Issues   HPI Lauren Stokes presents for Patient in for follow-up of GERD. Currently asymptomatic taking  PPI daily. There is no chest pain or heartburn. No hematemesis and no melena. No dysphagia or choking. Onset is remote. Progression is stable. Complicating factors, none.  Anxiety stable. Denies active depression sx.    presents for  follow-up of hypertension. Patient has no history of headache chest pain or shortness of breath or recent cough. Patient also denies symptoms of TIA such as focal numbness or weakness. Patient denies side effects from medication. States taking it regularly.      05/16/2022    1:27 PM 02/07/2022   11:21 AM 11/15/2021    1:46 PM  Depression screen PHQ 2/9  Decreased Interest 0 0 0  Down, Depressed, Hopeless 0 0 0  PHQ - 2 Score 0 0 0      08/10/2021    2:06 PM 05/16/2021    2:03 PM 11/15/2020    9:35 AM 05/12/2020    2:00 PM  GAD 7 : Generalized Anxiety Score  Nervous, Anxious, on Edge 0 0 0 0  Control/stop worrying 0 0 0 0  Worry too much - different things 0 1 0 0  Trouble relaxing 0 0 0 0  Restless 0 0 0 0  Easily annoyed or irritable 0 1 0 0  Afraid - awful might happen 0 0 0 0  Total GAD 7 Score 0 2 0 0  Anxiety Difficulty Not difficult at all Not difficult at all  Somewhat difficult      History Lauren Stokes has a past medical history of Allergy, Collagen vascular disease (HCC), Depression, Essential tremor, GERD (gastroesophageal reflux disease), and Hypertension.   She has a past surgical history that includes Cosmetic surgery and Cholecystectomy.   Her family history includes Aneurysm in her mother; Anxiety disorder in her mother; Cancer in her father; Hearing loss in her sister; Pneumonia in her mother; Ulcers in her mother.She reports that she has never smoked. She has never used smokeless tobacco. She reports  that she does not drink alcohol and does not use drugs.    ROS Review of Systems  Constitutional: Negative.   HENT: Negative.    Eyes:  Negative for visual disturbance.  Respiratory:  Negative for shortness of breath.   Cardiovascular:  Negative for chest pain.  Gastrointestinal:  Negative for abdominal pain.  Musculoskeletal:  Negative for arthralgias.    Objective:  BP 125/68   Pulse 84   Temp 98.1 F (36.7 C)   Ht 5\' 1"  (1.549 m)   Wt 150 lb 12.8 oz (68.4 kg)   SpO2 93%   BMI 28.49 kg/m   BP Readings from Last 3 Encounters:  05/16/22 125/68  12/18/21 134/68  11/15/21 126/71    Wt Readings from Last 3 Encounters:  05/16/22 150 lb 12.8 oz (68.4 kg)  02/07/22 150 lb (68 kg)  12/18/21 150 lb (68 kg)     Physical Exam Constitutional:      General: She is not in acute distress.    Appearance: She is well-developed.  Cardiovascular:     Rate and Rhythm: Normal rate and regular rhythm.  Pulmonary:     Breath sounds: Normal breath sounds.  Musculoskeletal:        General: Normal range of motion.  Skin:  General: Skin is warm and dry.  Neurological:     Mental Status: She is alert and oriented to person, place, and time.       Assessment & Plan:   Lauren Stokes was seen today for medical management of chronic issues.  Diagnoses and all orders for this visit:  Recurrent major depressive disorder, in full remission (HCC)  GAD (generalized anxiety disorder)  Benzodiazepine dependence (HCC)  Gastroesophageal reflux disease with esophagitis, unspecified whether hemorrhage -     omeprazole-sodium bicarbonate (ZEGERID) 40-1100 MG capsule; TAKE 1 CAPSULE EVERY MORNING BEFORE BREAKFAST  Essential hypertension -     losartan-hydrochlorothiazide (HYZAAR) 50-12.5 MG tablet; TAKE ONE (1) TABLET BY MOUTH EVERY DAY       I am having Mitchell D. Adderley maintain her calcium citrate-vitamin D, Cyanocobalamin (VITAMIN B 12 PO), acetaminophen, fluticasone,  clotrimazole-betamethasone, Boswellia-Glucosamine-Vit D (OSTEO BI-FLEX ONE PER DAY PO), alendronate, cetirizine, cholecalciferol, DULoxetine, montelukast, LORazepam, diclofenac, losartan-hydrochlorothiazide, and omeprazole-sodium bicarbonate.  Allergies as of 05/16/2022       Reactions   Cefaclor Nausea And Vomiting   Penicillins Rash   Tetracyclines & Related Rash   vomiting        Medication List        Accurate as of May 16, 2022 11:59 PM. If you have any questions, ask your nurse or doctor.          acetaminophen 650 MG CR tablet Commonly known as: TYLENOL Take 650 mg by mouth every 8 (eight) hours as needed for pain.   alendronate 70 MG tablet Commonly known as: FOSAMAX TAKE 1 TABLET WEEKLY (TAKE WITH 8OZ OF WATER 30 MINUTES BEFORE BREAKFAST)   calcium citrate-vitamin D 315-200 MG-UNIT tablet Commonly known as: CITRACAL+D Take 1 tablet by mouth daily.   cetirizine 10 MG tablet Commonly known as: ZYRTEC Take 1 tablet (10 mg total) by mouth daily.   cholecalciferol 25 MCG (1000 UNIT) tablet Commonly known as: VITAMIN D3 Take 1 tablet (1,000 Units total) by mouth daily.   clotrimazole-betamethasone cream Commonly known as: Lotrisone Apply 1 application topically 2 (two) times daily. To affected areas until rash clears   diclofenac 75 MG EC tablet Commonly known as: VOLTAREN TAKE ONE (1) TABLET BY MOUTH TWO (2) TIMES DAILY   DULoxetine 60 MG capsule Commonly known as: Cymbalta Take 1 capsule (60 mg total) by mouth daily.   fluticasone 50 MCG/ACT nasal spray Commonly known as: FLONASE Place 1 spray into both nostrils daily.   LORazepam 0.5 MG tablet Commonly known as: ATIVAN Take 1 tablet (0.5 mg total) by mouth at bedtime.   losartan-hydrochlorothiazide 50-12.5 MG tablet Commonly known as: HYZAAR TAKE ONE (1) TABLET BY MOUTH EVERY DAY   montelukast 10 MG tablet Commonly known as: SINGULAIR Take 1 tablet (10 mg total) by mouth daily.    omeprazole-sodium bicarbonate 40-1100 MG capsule Commonly known as: ZEGERID TAKE 1 CAPSULE EVERY MORNING BEFORE BREAKFAST   OSTEO BI-FLEX ONE PER DAY PO Take by mouth.   VITAMIN B 12 PO Take 1 tablet by mouth daily.         Follow-up: Return in about 6 months (around 11/15/2022).  Mechele Claude, M.D.

## 2022-05-19 ENCOUNTER — Encounter: Payer: Self-pay | Admitting: Family Medicine

## 2022-05-29 ENCOUNTER — Other Ambulatory Visit: Payer: Self-pay | Admitting: Family Medicine

## 2022-05-29 DIAGNOSIS — M1710 Unilateral primary osteoarthritis, unspecified knee: Secondary | ICD-10-CM

## 2022-06-08 IMAGING — DX DG LUMBAR SPINE 2-3V
2 series · 2 of 2 positions shown · non-contrast
Comparison: None.

CLINICAL DATA: Low back pain.

EXAM:
LUMBAR SPINE - 2-3 VIEW

[l-spine ap]
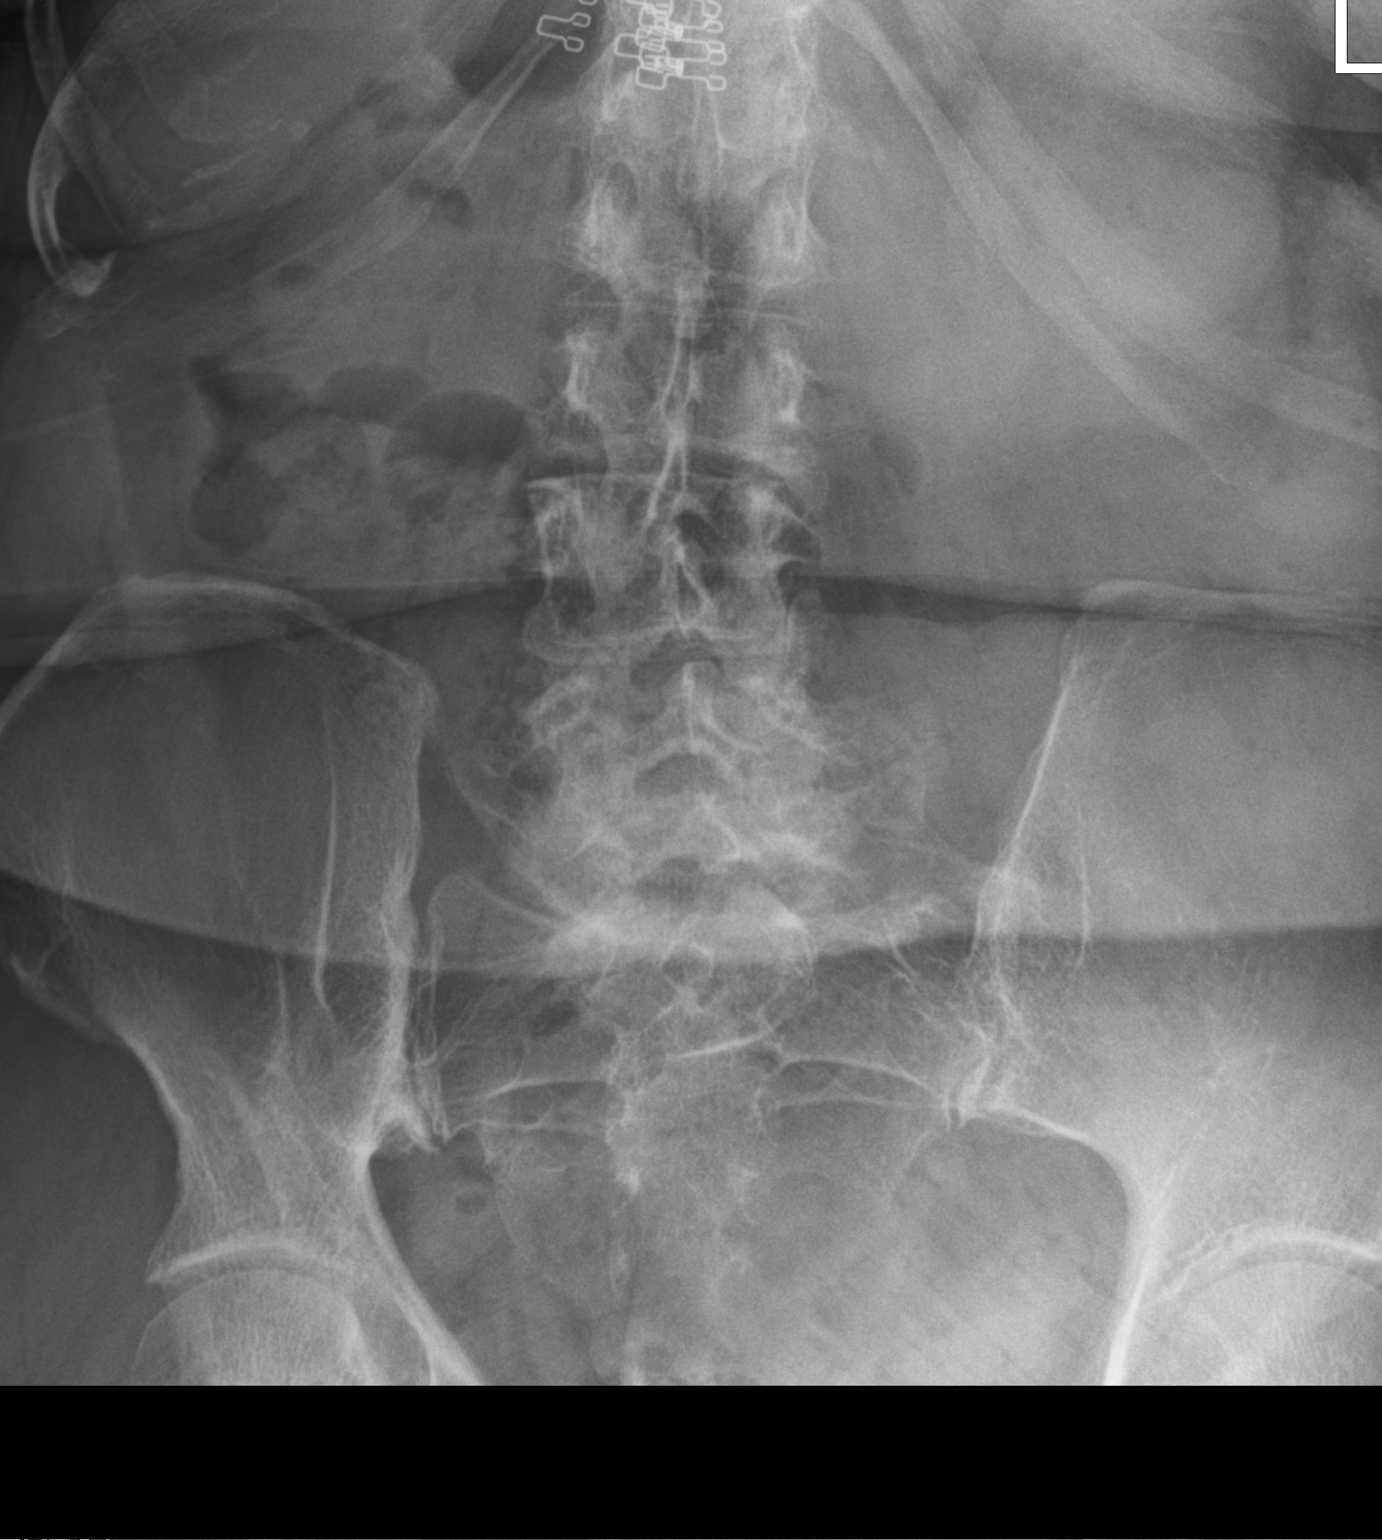

[l-spine lat]
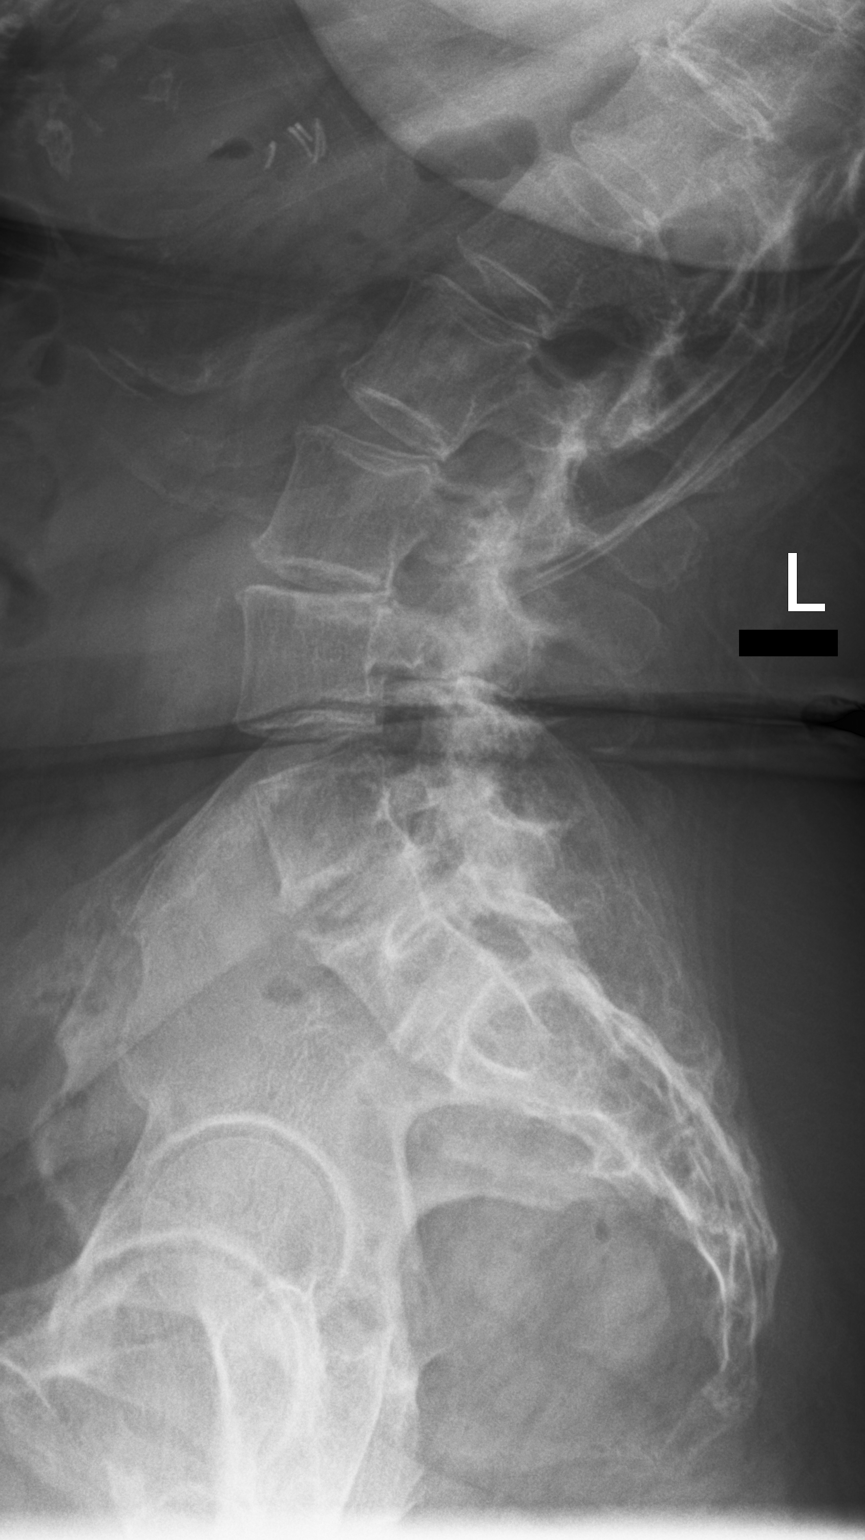

[2 of 2 positions shown; findings below may reference images not displayed]

FINDINGS: There is no evidence of lumbar spine fracture. Alignment is normal.
There is a least partial lumbarization of S1. Disc spaces are
maintained. There are mild degenerative endplate changes at L5-S1.
There surgical clips in the abdomen.
IMPRESSION: 1. No acute fracture or malalignment.
2. Mild degenerative changes at L5-S1.

## 2022-06-08 IMAGING — DX DG KNEE 1-2V*R*
2 series · 2 of 2 positions shown · non-contrast
Comparison: Left knee x-ray 05/16/2021.

CLINICAL DATA: Knee pain.

EXAM:
RIGHT KNEE - 1-2 VIEW

[knee ap]
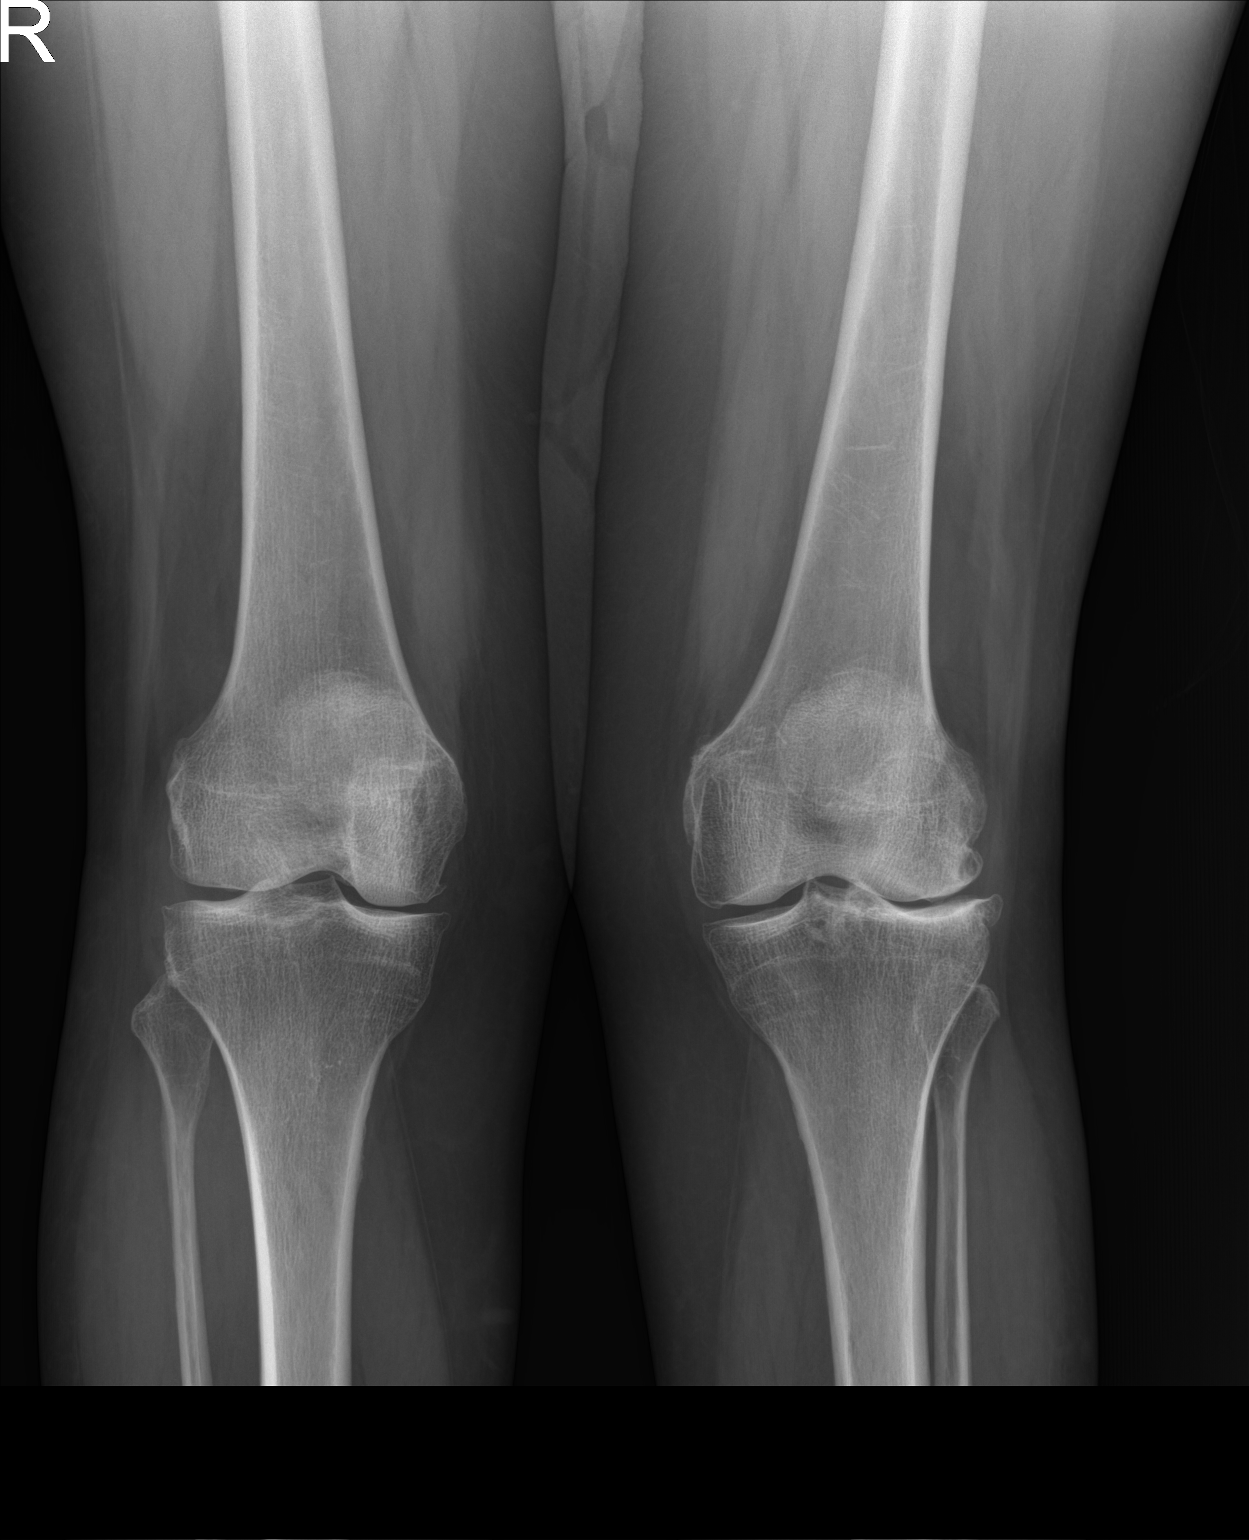

[knee lat]
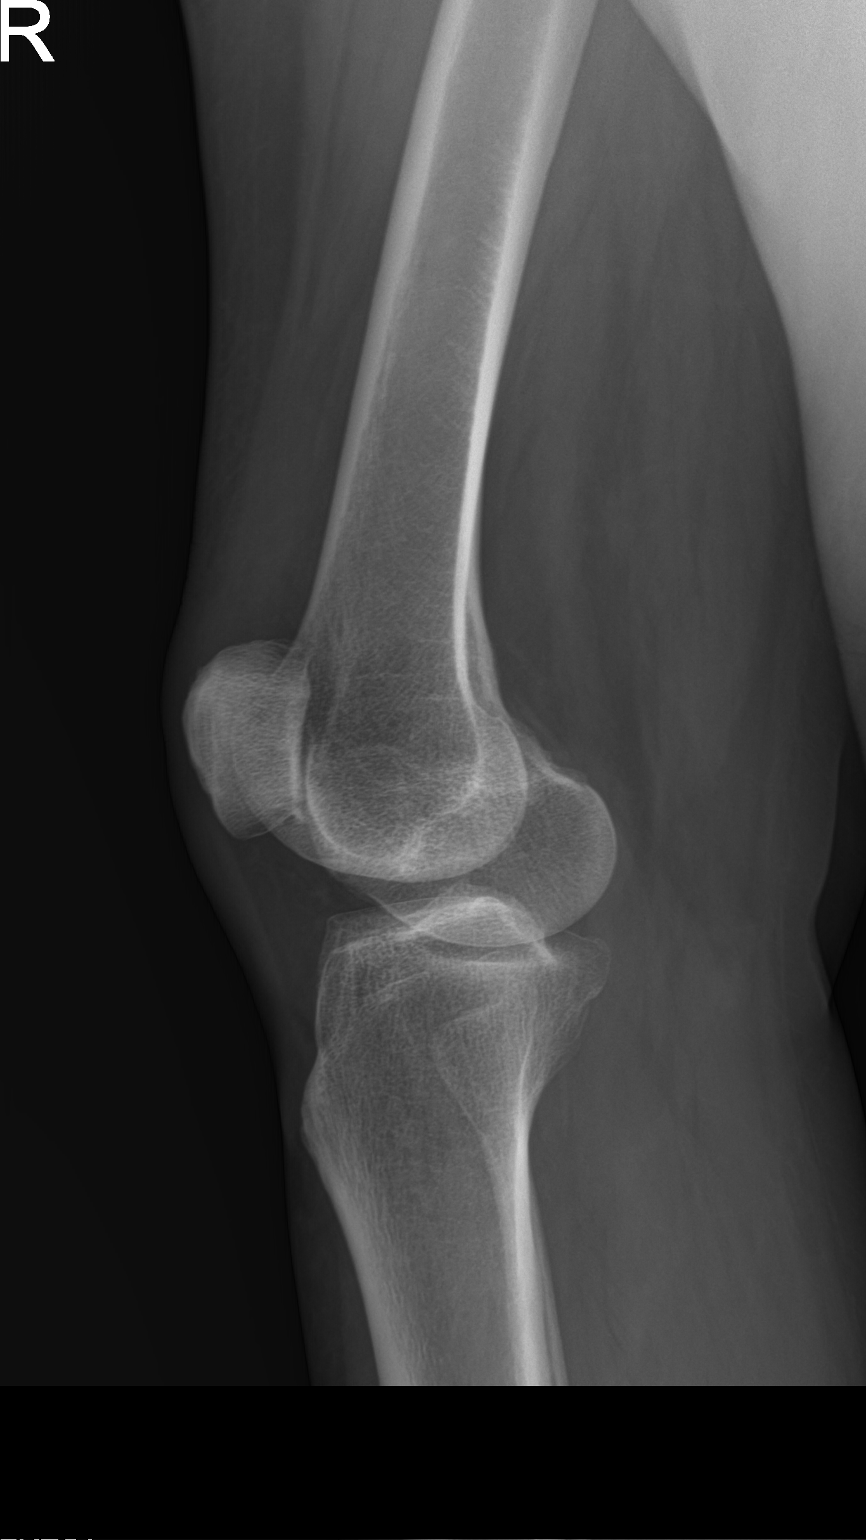

[2 of 2 positions shown; findings below may reference images not displayed]

FINDINGS: Left knee: There is no acute fracture or dislocation. There is no
joint effusion. There is mild patellofemoral and lateral compartment
joint space narrowing with osteophyte formation compatible with
degenerative change. No focal osseous lesions.

Right knee: Limited evaluation of the right knee demonstrates mild
degenerative changes of the medial compartment.
IMPRESSION: 1. No acute bony abnormality of the left knee.
2. Mild/moderate degenerative changes of the right knee most
significant in the lateral and patellofemoral compartments.

## 2022-06-08 IMAGING — DX DG HIP (WITH OR WITHOUT PELVIS) 2-3V*L*
3 series · 3 of 3 positions shown · non-contrast
Comparison: None.

CLINICAL DATA: Left hip pain

EXAM:
DG HIP (WITH OR WITHOUT PELVIS) 2-3V LEFT

[pelvis ap]
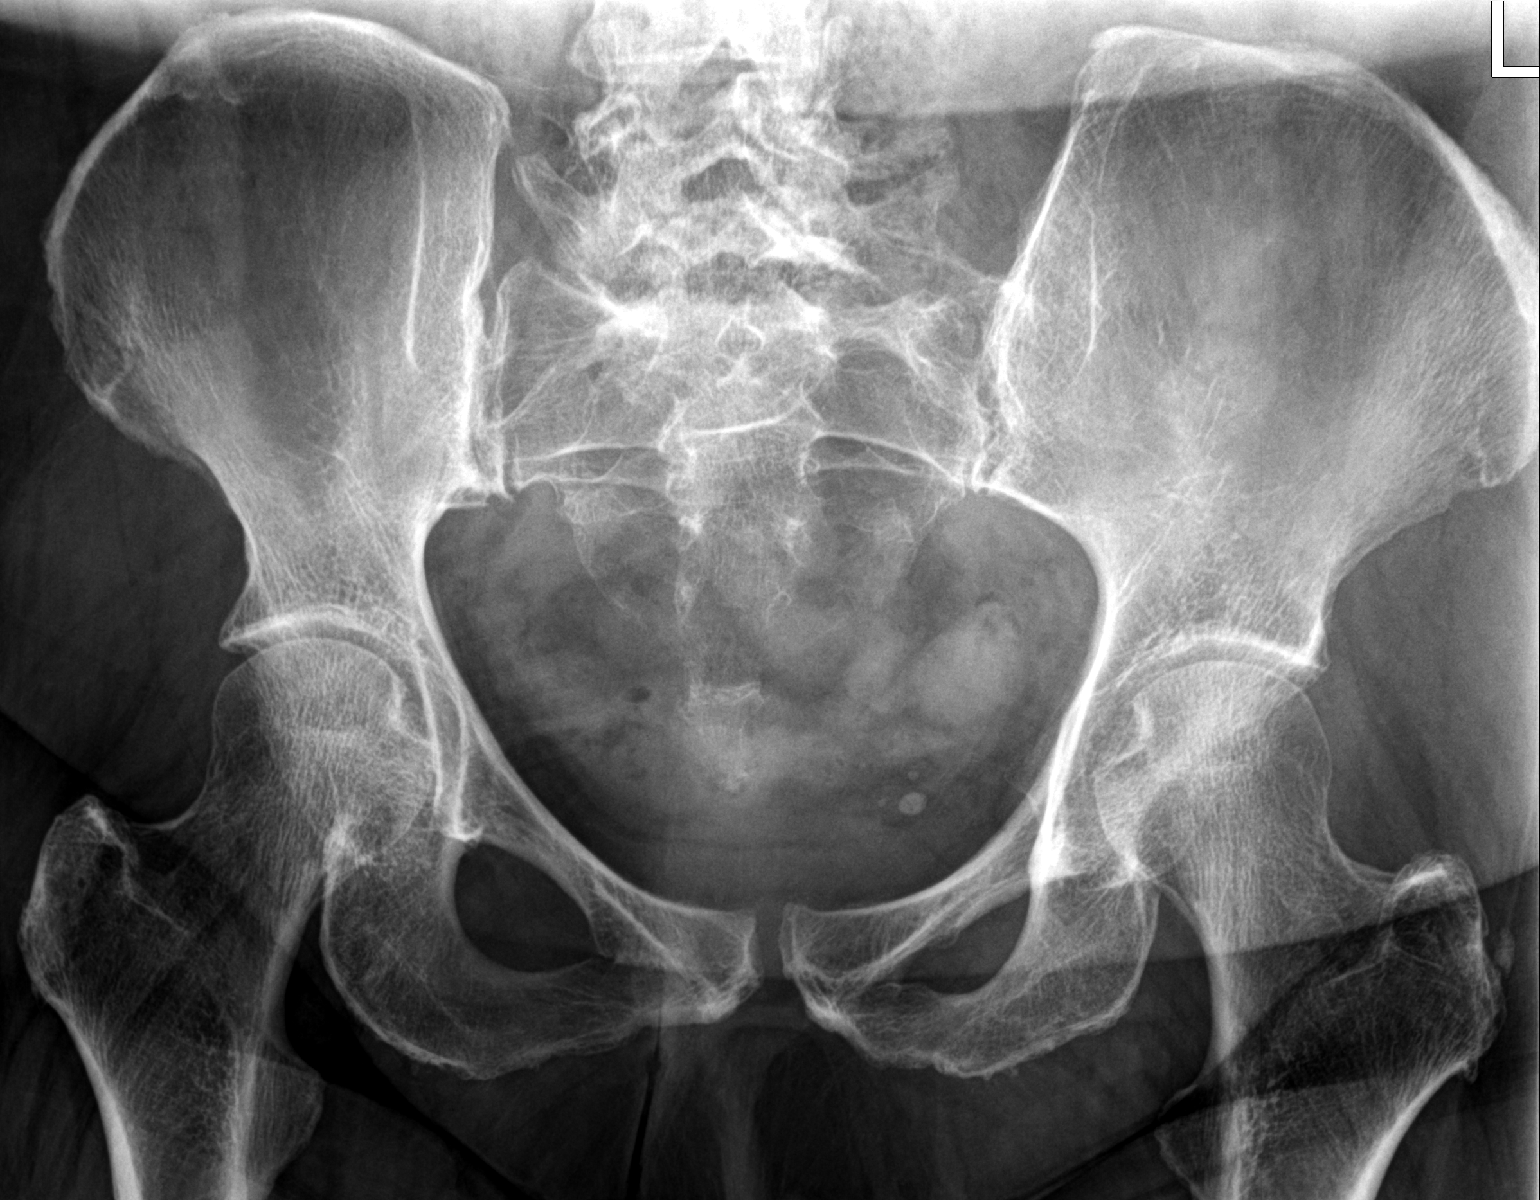

[hip ap]
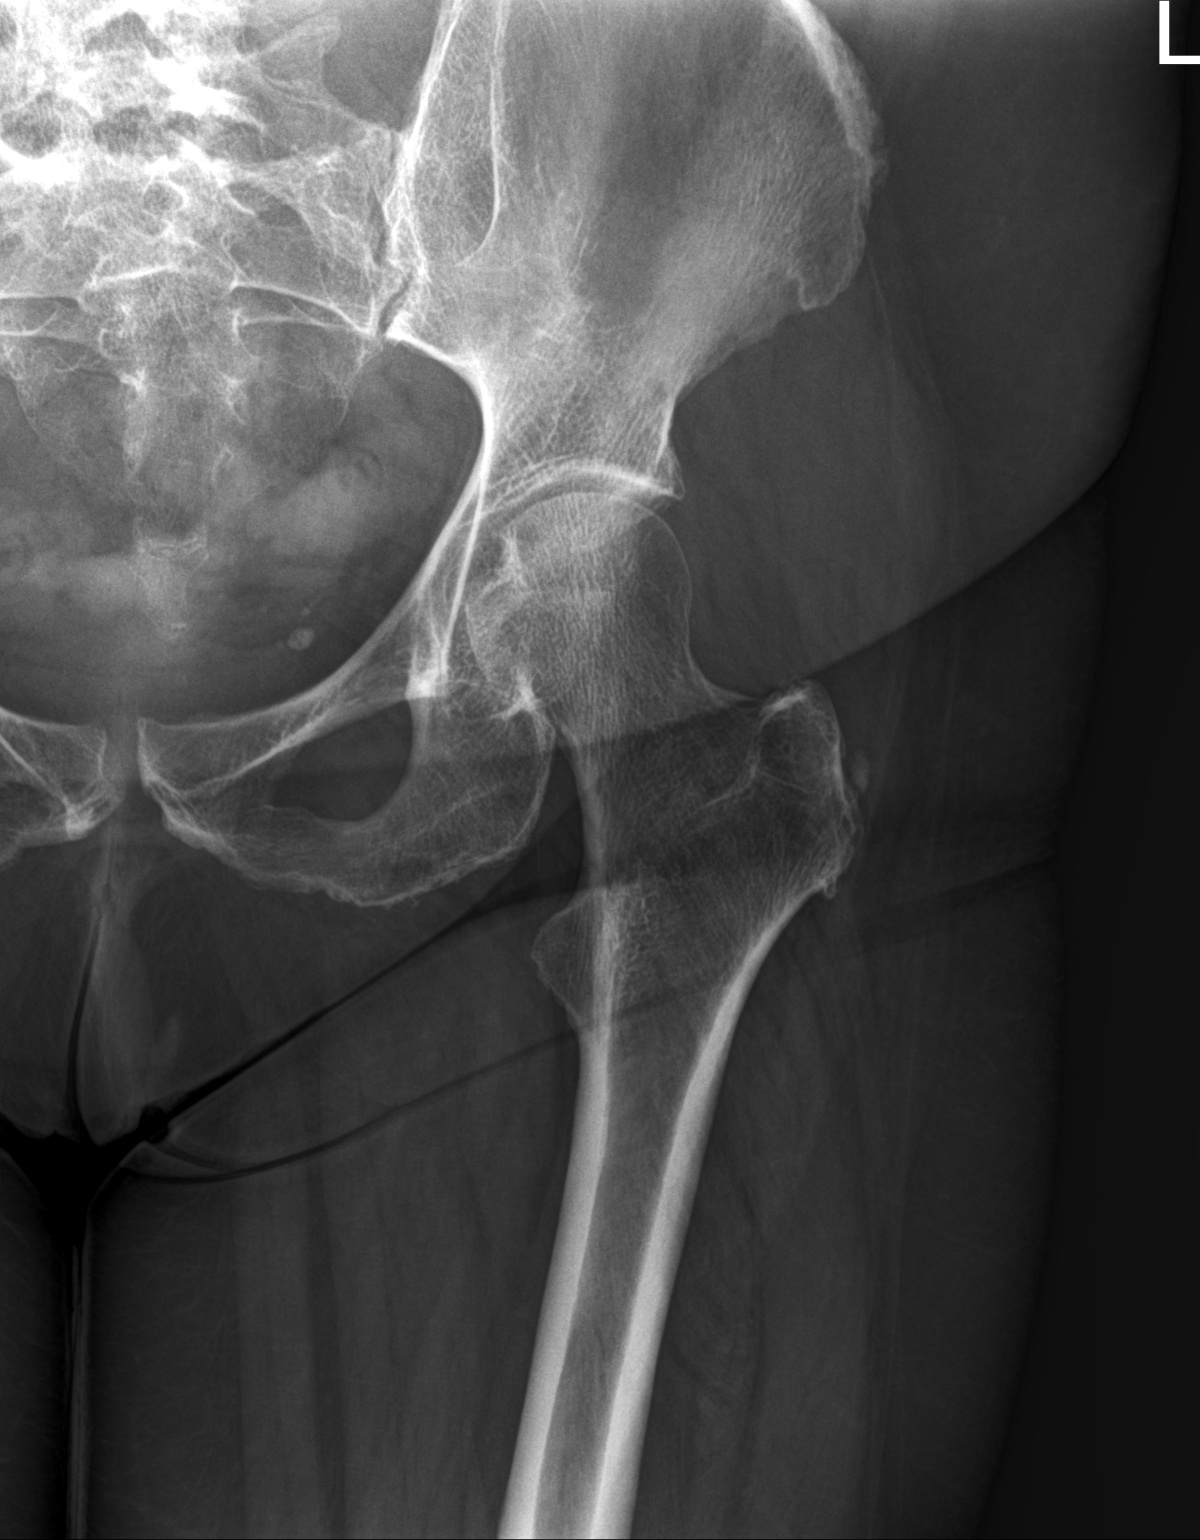

[hip lat]
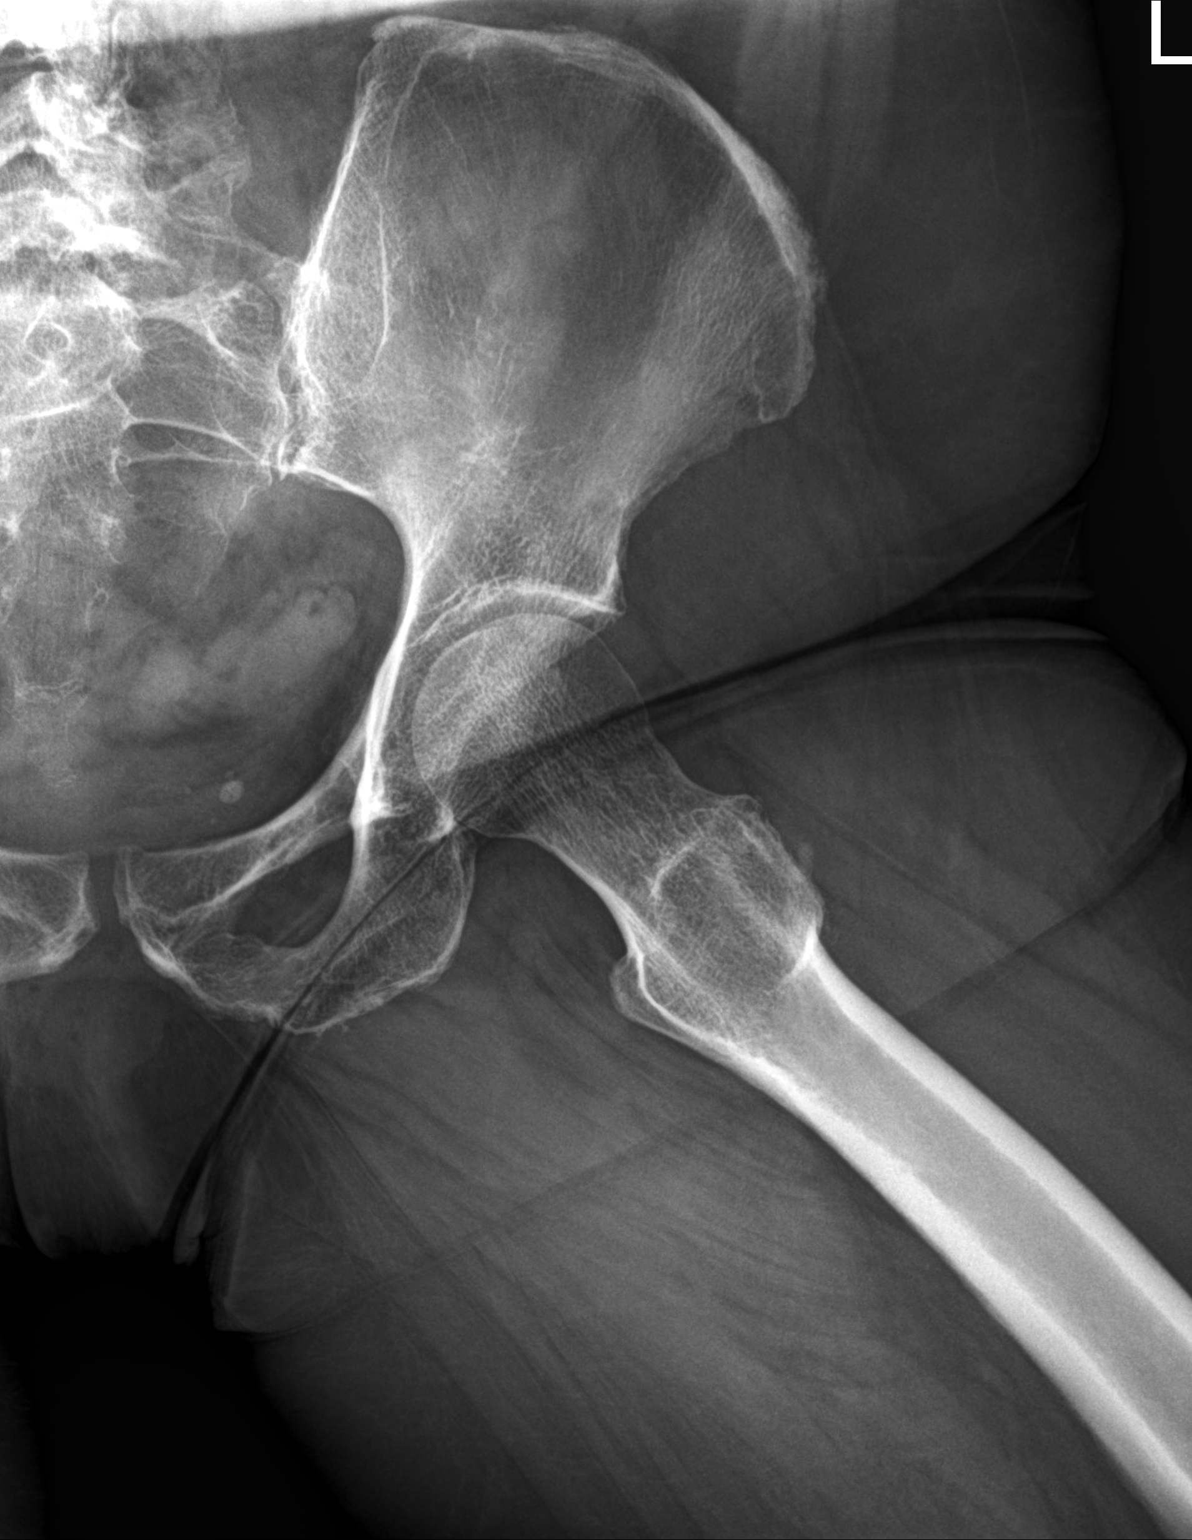

[3 of 3 positions shown; findings below may reference images not displayed]

FINDINGS: There is no evidence of hip fracture or dislocation. There is no
evidence of arthropathy or other focal bone abnormality.
IMPRESSION: Negative.

## 2022-08-23 ENCOUNTER — Other Ambulatory Visit: Payer: Self-pay | Admitting: Family Medicine

## 2022-08-23 DIAGNOSIS — F411 Generalized anxiety disorder: Secondary | ICD-10-CM

## 2022-08-23 DIAGNOSIS — F1320 Sedative, hypnotic or anxiolytic dependence, uncomplicated: Secondary | ICD-10-CM

## 2022-08-23 NOTE — Telephone Encounter (Signed)
Lorazepam denied, ntbs for refills.

## 2022-09-03 NOTE — Telephone Encounter (Signed)
Pt came in today re: refill on her Lorazepam Last OV 05/16/22 RTC 6 mos. Last RF 11/15/21. Next OV 11/15/22 Please advise

## 2022-09-05 NOTE — Telephone Encounter (Signed)
Pt aware message was sent to Dr. Livia Snellen and he said pt would have to be seen for refill on medication. Appt made for earliest available on 09/26/22

## 2022-09-26 ENCOUNTER — Ambulatory Visit (INDEPENDENT_AMBULATORY_CARE_PROVIDER_SITE_OTHER): Payer: Medicare Other | Admitting: Family Medicine

## 2022-09-26 ENCOUNTER — Encounter: Payer: Self-pay | Admitting: Family Medicine

## 2022-09-26 VITALS — BP 125/70 | HR 85 | Temp 97.6°F | Ht 61.0 in | Wt 151.0 lb

## 2022-09-26 DIAGNOSIS — J3089 Other allergic rhinitis: Secondary | ICD-10-CM | POA: Diagnosis not present

## 2022-09-26 DIAGNOSIS — Z23 Encounter for immunization: Secondary | ICD-10-CM | POA: Diagnosis not present

## 2022-09-26 DIAGNOSIS — F1320 Sedative, hypnotic or anxiolytic dependence, uncomplicated: Secondary | ICD-10-CM | POA: Diagnosis not present

## 2022-09-26 DIAGNOSIS — F411 Generalized anxiety disorder: Secondary | ICD-10-CM | POA: Diagnosis not present

## 2022-09-26 DIAGNOSIS — I10 Essential (primary) hypertension: Secondary | ICD-10-CM

## 2022-09-26 DIAGNOSIS — E559 Vitamin D deficiency, unspecified: Secondary | ICD-10-CM | POA: Diagnosis not present

## 2022-09-26 MED ORDER — FEXOFENADINE-PSEUDOEPHED ER 180-240 MG PO TB24: 1.0000 | ORAL_TABLET | Freq: Every evening | ORAL | 11 refills | Status: DC

## 2022-09-26 MED ORDER — LORAZEPAM 0.5 MG PO TABS: 0.5000 mg | ORAL_TABLET | Freq: Every day | ORAL | 1 refills | Status: DC

## 2022-09-26 MED ORDER — LOSARTAN POTASSIUM-HCTZ 50-12.5 MG PO TABS: ORAL_TABLET | ORAL | 2 refills | Status: DC

## 2022-09-26 MED ORDER — ALENDRONATE SODIUM 70 MG PO TABS: ORAL_TABLET | ORAL | 3 refills | Status: DC

## 2022-09-26 MED ORDER — DULOXETINE HCL 60 MG PO CPEP: 60.0000 mg | ORAL_CAPSULE | Freq: Every day | ORAL | 3 refills | Status: DC

## 2022-09-26 NOTE — Progress Notes (Signed)
Subjective:  Patient ID: Lauren Stokes, female    DOB: 1946-07-18  Age: 76 y.o. MRN: [id number]  CC: Medical Management of Chronic Issues   HPI Lauren Stokes presents for  follow-up of hypertension. Patient has no history of headache chest pain or shortness of breath or recent cough. Patient also denies symptoms of TIA such as focal numbness or weakness. Patient denies side effects from medication. States taking it regularly.  No active anxiety or depression except occasionally has to have a good cry, but gets over it quickly and functions well.   Having allergy sx with posterior drainage in spite of use of zyrtec daily.      09/26/2022    1:38 PM 05/16/2022    1:27 PM 02/07/2022   11:21 AM 11/15/2021    1:46 PM 08/10/2021    2:06 PM  Depression screen PHQ 2/9  Decreased Interest 0 0 0 0 0  Down, Depressed, Hopeless 0 0 0 0 0  PHQ - 2 Score 0 0 0 0 0  Altered sleeping     0  Tired, decreased energy     0  Change in appetite     0  Feeling bad or failure about yourself      0  Trouble concentrating     0  Moving slowly or fidgety/restless     0  Suicidal thoughts     0  PHQ-9 Score     0  Difficult doing work/chores     Not difficult at all      History Lauren Stokes has a past medical history of Allergy, Collagen vascular disease ([place]), Depression, Essential tremor, GERD (gastroesophageal reflux disease), and Hypertension.   She has a past surgical history that includes Cosmetic surgery and Cholecystectomy.   Her family history includes Aneurysm in her mother; Anxiety disorder in her mother; Cancer in her father; Hearing loss in her sister; Pneumonia in her mother; Ulcers in her mother.She reports that she has never smoked. She has never used smokeless tobacco. She reports that she does not drink alcohol and does not use drugs.  Current Outpatient Medications on File Prior to Visit  Medication Sig Dispense Refill   acetaminophen (TYLENOL) 650 MG CR tablet Take 650 mg by mouth  every 8 (eight) hours as needed for pain.     Boswellia-Glucosamine-Vit D (OSTEO BI-FLEX ONE PER DAY PO) Take by mouth.     calcium citrate-vitamin D (CITRACAL+D) 315-200 MG-UNIT tablet Take 1 tablet by mouth daily.     cholecalciferol (VITAMIN D3) 25 MCG (1000 UNIT) tablet Take 1 tablet (1,000 Units total) by mouth daily. 90 tablet 2   clotrimazole-betamethasone (LOTRISONE) cream Apply 1 application topically 2 (two) times daily. To affected areas until rash clears 45 g 1   Cyanocobalamin (VITAMIN B 12 PO) Take 1 tablet by mouth daily.     diclofenac (VOLTAREN) 75 MG EC tablet TAKE ONE (1) TABLET BY MOUTH TWO (2) TIMES DAILY 180 tablet 1   fluticasone (FLONASE) 50 MCG/ACT nasal spray Place 1 spray into both nostrils daily. 18.2 mL 5   montelukast (SINGULAIR) 10 MG tablet Take 1 tablet (10 mg total) by mouth daily. 90 tablet 3   omeprazole-sodium bicarbonate (ZEGERID) 40-1100 MG capsule TAKE 1 CAPSULE EVERY MORNING BEFORE BREAKFAST 90 capsule 1   No current facility-administered medications on file prior to visit.    ROS Review of Systems  Constitutional: Negative.  Negative for activity change, appetite change, chills and fever.  HENT:  Positive for postnasal drip and sneezing. Negative for ear discharge, ear pain, hearing loss, nosebleeds and trouble swallowing.   Eyes:  Negative for visual disturbance.  Respiratory:  Negative for chest tightness and shortness of breath.   Cardiovascular:  Negative for chest pain and palpitations.  Gastrointestinal:  Negative for abdominal pain.  Musculoskeletal:  Negative for arthralgias.  Skin:  Negative for rash.    Objective:  BP 125/70   Pulse 85   Temp 97.6 F (36.4 C)   Ht 5\' 1"  (1.549 m)   Wt 151 lb (68.5 kg)   SpO2 93%   BMI 28.53 kg/m   BP Readings from Last 3 Encounters:  09/26/22 125/70  05/16/22 125/68  12/18/21 134/68    Wt Readings from Last 3 Encounters:  09/26/22 151 lb (68.5 kg)  05/16/22 150 lb 12.8 oz (68.4 kg)   02/07/22 150 lb (68 kg)     Physical Exam Constitutional:      General: She is not in acute distress.    Appearance: She is well-developed.  Cardiovascular:     Rate and Rhythm: Normal rate and regular rhythm.  Pulmonary:     Breath sounds: Normal breath sounds.  Musculoskeletal:        General: Normal range of motion.  Skin:    General: Skin is warm and dry.  Neurological:     Mental Status: She is alert and oriented to person, place, and time.       Assessment & Plan:   Lauren Stokes was seen today for medical management of chronic issues.  Diagnoses and all orders for this visit:  Non-seasonal allergic rhinitis, unspecified trigger  Vitamin D deficiency  GAD (generalized anxiety disorder) -     LORazepam (ATIVAN) 0.5 MG tablet; Take 1 tablet (0.5 mg total) by mouth at bedtime.  Benzodiazepine dependence (HCC) -     LORazepam (ATIVAN) 0.5 MG tablet; Take 1 tablet (0.5 mg total) by mouth at bedtime.  Essential hypertension -     losartan-hydrochlorothiazide (HYZAAR) 50-12.5 MG tablet; TAKE ONE (1) TABLET BY MOUTH EVERY DAY  Other orders -     DULoxetine (CYMBALTA) 60 MG capsule; Take 1 capsule (60 mg total) by mouth daily. -     alendronate (FOSAMAX) 70 MG tablet; TAKE 1 TABLET WEEKLY (TAKE WITH 8OZ OF WATER 30 MINUTES BEFORE BREAKFAST) -     fexofenadine-pseudoephedrine (ALLEGRA-D 24) 180-240 MG 24 hr tablet; Take 1 tablet by mouth every evening. For allergy and congestion   Allergies as of 09/26/2022       Reactions   Cefaclor Nausea And Vomiting   Penicillins Rash   Tetracyclines & Related Rash   vomiting        Medication List        Accurate as of September 26, 2022  2:17 PM. If you have any questions, ask your nurse or doctor.          STOP taking these medications    cetirizine 10 MG tablet Commonly known as: ZYRTEC Stopped by: Claretta Fraise, MD       TAKE these medications    acetaminophen 650 MG CR tablet Commonly known as:  TYLENOL Take 650 mg by mouth every 8 (eight) hours as needed for pain.   alendronate 70 MG tablet Commonly known as: FOSAMAX TAKE 1 TABLET WEEKLY (TAKE WITH 8OZ OF WATER 30 MINUTES BEFORE BREAKFAST)   calcium citrate-vitamin D 315-200 MG-UNIT tablet Commonly known as: CITRACAL+D Take 1 tablet by mouth daily.  cholecalciferol 25 MCG (1000 UNIT) tablet Commonly known as: VITAMIN D3 Take 1 tablet (1,000 Units total) by mouth daily.   clotrimazole-betamethasone cream Commonly known as: Lotrisone Apply 1 application topically 2 (two) times daily. To affected areas until rash clears   diclofenac 75 MG EC tablet Commonly known as: VOLTAREN TAKE ONE (1) TABLET BY MOUTH TWO (2) TIMES DAILY   DULoxetine 60 MG capsule Commonly known as: Cymbalta Take 1 capsule (60 mg total) by mouth daily.   fexofenadine-pseudoephedrine 180-240 MG 24 hr tablet Commonly known as: ALLEGRA-D 24 Take 1 tablet by mouth every evening. For allergy and congestion Started by: Claretta Fraise, MD   fluticasone 50 MCG/ACT nasal spray Commonly known as: FLONASE Place 1 spray into both nostrils daily.   LORazepam 0.5 MG tablet Commonly known as: ATIVAN Take 1 tablet (0.5 mg total) by mouth at bedtime.   losartan-hydrochlorothiazide 50-12.5 MG tablet Commonly known as: HYZAAR TAKE ONE (1) TABLET BY MOUTH EVERY DAY   montelukast 10 MG tablet Commonly known as: SINGULAIR Take 1 tablet (10 mg total) by mouth daily.   omeprazole-sodium bicarbonate 40-1100 MG capsule Commonly known as: ZEGERID TAKE 1 CAPSULE EVERY MORNING BEFORE BREAKFAST   OSTEO BI-FLEX ONE PER DAY PO Take by mouth.   VITAMIN B 12 PO Take 1 tablet by mouth daily.        Meds ordered this encounter  Medications   losartan-hydrochlorothiazide (HYZAAR) 50-12.5 MG tablet    Sig: TAKE ONE (1) TABLET BY MOUTH EVERY DAY    Dispense:  90 tablet    Refill:  2   DULoxetine (CYMBALTA) 60 MG capsule    Sig: Take 1 capsule (60 mg total) by  mouth daily.    Dispense:  90 capsule    Refill:  3   alendronate (FOSAMAX) 70 MG tablet    Sig: TAKE 1 TABLET WEEKLY (TAKE WITH 8OZ OF WATER 30 MINUTES BEFORE BREAKFAST)    Dispense:  13 tablet    Refill:  3   fexofenadine-pseudoephedrine (ALLEGRA-D 24) 180-240 MG 24 hr tablet    Sig: Take 1 tablet by mouth every evening. For allergy and congestion    Dispense:  30 tablet    Refill:  11   LORazepam (ATIVAN) 0.5 MG tablet    Sig: Take 1 tablet (0.5 mg total) by mouth at bedtime.    Dispense:  90 tablet    Refill:  1      Follow-up: Return in about 6 months (around 03/28/2023).  Claretta Fraise, M.D.

## 2022-10-18 ENCOUNTER — Telehealth: Payer: Self-pay | Admitting: Family Medicine

## 2022-10-18 NOTE — Telephone Encounter (Signed)
PA approved for omeprazole-sodium bicarbonate (ZEGERID) 40-1100 MG capsule effective 10/17/2022 for one yr

## 2022-11-15 ENCOUNTER — Ambulatory Visit: Payer: Medicare Other | Admitting: Family Medicine

## 2022-11-20 ENCOUNTER — Ambulatory Visit: Payer: Medicare Other | Admitting: Family Medicine

## 2022-11-28 ENCOUNTER — Other Ambulatory Visit: Payer: Self-pay | Admitting: Family Medicine

## 2022-11-28 DIAGNOSIS — M1710 Unilateral primary osteoarthritis, unspecified knee: Secondary | ICD-10-CM

## 2022-12-11 DIAGNOSIS — M79672 Pain in left foot: Secondary | ICD-10-CM | POA: Diagnosis not present

## 2022-12-11 DIAGNOSIS — M25572 Pain in left ankle and joints of left foot: Secondary | ICD-10-CM | POA: Diagnosis not present

## 2022-12-31 ENCOUNTER — Other Ambulatory Visit: Payer: Self-pay | Admitting: Family Medicine

## 2022-12-31 ENCOUNTER — Ambulatory Visit (INDEPENDENT_AMBULATORY_CARE_PROVIDER_SITE_OTHER): Payer: Medicare Other | Admitting: Family Medicine

## 2022-12-31 ENCOUNTER — Encounter: Payer: Self-pay | Admitting: Family Medicine

## 2022-12-31 VITALS — BP 152/76 | HR 80 | Temp 97.3°F | Ht 61.0 in | Wt 150.4 lb

## 2022-12-31 DIAGNOSIS — K2100 Gastro-esophageal reflux disease with esophagitis, without bleeding: Secondary | ICD-10-CM | POA: Diagnosis not present

## 2022-12-31 DIAGNOSIS — F411 Generalized anxiety disorder: Secondary | ICD-10-CM

## 2022-12-31 DIAGNOSIS — F1320 Sedative, hypnotic or anxiolytic dependence, uncomplicated: Secondary | ICD-10-CM

## 2022-12-31 DIAGNOSIS — I10 Essential (primary) hypertension: Secondary | ICD-10-CM

## 2022-12-31 DIAGNOSIS — Z79899 Other long term (current) drug therapy: Secondary | ICD-10-CM

## 2022-12-31 DIAGNOSIS — Z13220 Encounter for screening for lipoid disorders: Secondary | ICD-10-CM

## 2022-12-31 DIAGNOSIS — F331 Major depressive disorder, recurrent, moderate: Secondary | ICD-10-CM

## 2022-12-31 DIAGNOSIS — E538 Deficiency of other specified B group vitamins: Secondary | ICD-10-CM | POA: Diagnosis not present

## 2022-12-31 MED ORDER — OMEPRAZOLE-SODIUM BICARBONATE 40-1100 MG PO CAPS: ORAL_CAPSULE | ORAL | 1 refills | Status: DC

## 2022-12-31 NOTE — Progress Notes (Signed)
Subjective:  Patient ID: Lauren Stokes, female    DOB: [birth year]  Age: 77 y.o. MRN: [id number]  CC: Medical Management of Chronic Issues   HPI KAYDIE PETSCH presents for  follow-up of hypertension. Patient has no history of headache chest pain or shortness of breath or recent cough. Patient also denies symptoms of TIA such as focal numbness or weakness. Patient denies side effects from medication. States taking it regularly.  Patient in for follow-up of GERD. Currently asymptomatic taking  PPI daily. There is no chest pain or heartburn. No hematemesis and no melena. No dysphagia or choking. Onset is remote. Progression is stable. Complicating factors, none.  Depression and anxiety stable with current tx. Long term usser of benzodiazepine.CSA completed today.     12/31/2022    3:07 PM 09/26/2022    1:38 PM 05/16/2022    1:27 PM 02/07/2022   11:21 AM 11/15/2021    1:46 PM  Depression screen PHQ 2/9  Decreased Interest 0 0 0 0 0  Down, Depressed, Hopeless 0 0 0 0 0  PHQ - 2 Score 0 0 0 0 0     History Marny has a past medical history of Allergy, Collagen vascular disease ([place]), Depression, Essential tremor, GERD (gastroesophageal reflux disease), and Hypertension.   She has a past surgical history that includes Cosmetic surgery and Cholecystectomy.   Her family history includes Aneurysm in her mother; Anxiety disorder in her mother; Cancer in her father; Hearing loss in her sister; Pneumonia in her mother; Ulcers in her mother.She reports that she has never smoked. She has never used smokeless tobacco. She reports that she does not drink alcohol and does not use drugs.  Current Outpatient Medications on File Prior to Visit  Medication Sig Dispense Refill   acetaminophen (TYLENOL) 650 MG CR tablet Take 650 mg by mouth every 8 (eight) hours as needed for pain.     alendronate (FOSAMAX) 70 MG tablet TAKE 1 TABLET WEEKLY (TAKE WITH 8OZ OF WATER 30 MINUTES BEFORE BREAKFAST) 13 tablet 3    Boswellia-Glucosamine-Vit D (OSTEO BI-FLEX ONE PER DAY PO) Take by mouth.     calcium citrate-vitamin D (CITRACAL+D) 315-200 MG-UNIT tablet Take 1 tablet by mouth daily.     cholecalciferol (VITAMIN D3) 25 MCG (1000 UNIT) tablet Take 1 tablet (1,000 Units total) by mouth daily. 90 tablet 2   clotrimazole-betamethasone (LOTRISONE) cream Apply 1 application topically 2 (two) times daily. To affected areas until rash clears 45 g 1   Cyanocobalamin (VITAMIN B 12 PO) Take 1 tablet by mouth daily.     diclofenac (VOLTAREN) 75 MG EC tablet TAKE ONE (1) TABLET BY MOUTH TWO (2) TIMES DAILY 180 tablet 0   DULoxetine (CYMBALTA) 60 MG capsule Take 1 capsule (60 mg total) by mouth daily. 90 capsule 3   fexofenadine-pseudoephedrine (ALLEGRA-D 24) 180-240 MG 24 hr tablet Take 1 tablet by mouth every evening. For allergy and congestion 30 tablet 11   fluticasone (FLONASE) 50 MCG/ACT nasal spray Place 1 spray into both nostrils daily. 18.2 mL 5   losartan-hydrochlorothiazide (HYZAAR) 50-12.5 MG tablet TAKE ONE (1) TABLET BY MOUTH EVERY DAY 90 tablet 2   montelukast (SINGULAIR) 10 MG tablet Take 1 tablet (10 mg total) by mouth daily. 90 tablet 3   No current facility-administered medications on file prior to visit.    ROS Review of Systems  Constitutional: Negative.   HENT: Negative.    Eyes:  Negative for visual disturbance.  Respiratory:  Negative  for shortness of breath.   Cardiovascular:  Negative for chest pain.  Gastrointestinal:  Negative for abdominal pain.  Musculoskeletal:  Negative for arthralgias.    Objective:  BP (!) 152/76   Pulse 80   Temp (!) 97.3 F (36.3 C)   Ht 5\' 1"  (1.549 m)   Wt 150 lb 6.4 oz (68.2 kg)   SpO2 96%   BMI 28.42 kg/m   BP Readings from Last 3 Encounters:  12/31/22 (!) 152/76  09/26/22 125/70  05/16/22 125/68    Wt Readings from Last 3 Encounters:  12/31/22 150 lb 6.4 oz (68.2 kg)  09/26/22 151 lb (68.5 kg)  05/16/22 150 lb 12.8 oz (68.4 kg)      Physical Exam Constitutional:      General: She is not in acute distress.    Appearance: She is well-developed.  Cardiovascular:     Rate and Rhythm: Normal rate and regular rhythm.  Pulmonary:     Breath sounds: Normal breath sounds.  Musculoskeletal:        General: Normal range of motion.  Skin:    General: Skin is warm and dry.  Neurological:     Mental Status: She is alert and oriented to person, place, and time.       Assessment & Plan:   Shreeya was seen today for medical management of chronic issues.  Diagnoses and all orders for this visit:  Essential hypertension  Screening, lipid  Gastroesophageal reflux disease with esophagitis, unspecified whether hemorrhage -     omeprazole-sodium bicarbonate (ZEGERID) 40-1100 MG capsule; TAKE 1 CAPSULE EVERY MORNING BEFORE BREAKFAST  Controlled substance agreement signed -     [employer] 13 ([place]), Urine  Moderate episode of recurrent major depressive disorder (HCC)  GAD (generalized anxiety disorder)  Vitamin B 12 deficiency -     Vitamin B12   Allergies as of 12/31/2022       Reactions   Cefaclor Nausea And Vomiting   Penicillins Rash   Tetracyclines & Related Rash   vomiting        Medication List        Accurate as of December 31, 2022  9:30 PM. If you have any questions, ask your nurse or doctor.          acetaminophen 650 MG CR tablet Commonly known as: TYLENOL Take 650 mg by mouth every 8 (eight) hours as needed for pain.   alendronate 70 MG tablet Commonly known as: FOSAMAX TAKE 1 TABLET WEEKLY (TAKE WITH 8OZ OF WATER 30 MINUTES BEFORE BREAKFAST)   calcium citrate-vitamin D 315-200 MG-UNIT tablet Commonly known as: CITRACAL+D Take 1 tablet by mouth daily.   cholecalciferol 25 MCG (1000 UNIT) tablet Commonly known as: VITAMIN D3 Take 1 tablet (1,000 Units total) by mouth daily.   clotrimazole-betamethasone cream Commonly known as: Lotrisone Apply 1 application topically 2  (two) times daily. To affected areas until rash clears   diclofenac 75 MG EC tablet Commonly known as: VOLTAREN TAKE ONE (1) TABLET BY MOUTH TWO (2) TIMES DAILY   DULoxetine 60 MG capsule Commonly known as: Cymbalta Take 1 capsule (60 mg total) by mouth daily.   fexofenadine-pseudoephedrine 180-240 MG 24 hr tablet Commonly known as: ALLEGRA-D 24 Take 1 tablet by mouth every evening. For allergy and congestion   fluticasone 50 MCG/ACT nasal spray Commonly known as: FLONASE Place 1 spray into both nostrils daily.   LORazepam 0.5 MG tablet Commonly known as: ATIVAN TAKE ONE TABLET BY MOUTH AT BEDTIME  losartan-hydrochlorothiazide 50-12.5 MG tablet Commonly known as: HYZAAR TAKE ONE (1) TABLET BY MOUTH EVERY DAY   montelukast 10 MG tablet Commonly known as: SINGULAIR Take 1 tablet (10 mg total) by mouth daily.   omeprazole-sodium bicarbonate 40-1100 MG capsule Commonly known as: ZEGERID TAKE 1 CAPSULE EVERY MORNING BEFORE BREAKFAST   OSTEO BI-FLEX ONE PER DAY PO Take by mouth.   VITAMIN B 12 PO Take 1 tablet by mouth daily.        Meds ordered this encounter  Medications   omeprazole-sodium bicarbonate (ZEGERID) 40-1100 MG capsule    Sig: TAKE 1 CAPSULE EVERY MORNING BEFORE BREAKFAST    Dispense:  90 capsule    Refill:  1     Follow-up: Return in about 1 month (around 01/31/2023).  Mechele Claude, M.D.

## 2022-12-31 NOTE — Telephone Encounter (Signed)
TC to [place] at [facility] and made him aware to cancel refill on previous script

## 2022-12-31 NOTE — Telephone Encounter (Signed)
The previous scrip had a refill on it. Please cancel the refill since I am sending a new scrip

## 2023-01-01 LAB — VITAMIN B12: Vitamin B-12: 1027 pg/mL (ref 232–1245)

## 2023-01-01 NOTE — Progress Notes (Signed)
Hello Lauren Stokes,  Your lab result is normal and/or stable.Some minor variations that are not significant are commonly marked abnormal, but do not represent any medical problem for you.  Best regards, Claretta Fraise, M.D.

## 2023-01-02 LAB — TOXASSURE SELECT 13 (MW), URINE

## 2023-01-10 ENCOUNTER — Ambulatory Visit (INDEPENDENT_AMBULATORY_CARE_PROVIDER_SITE_OTHER): Payer: Medicare Other | Admitting: *Deleted

## 2023-01-10 VITALS — BP 118/68

## 2023-01-10 DIAGNOSIS — I10 Essential (primary) hypertension: Secondary | ICD-10-CM

## 2023-02-11 ENCOUNTER — Ambulatory Visit (INDEPENDENT_AMBULATORY_CARE_PROVIDER_SITE_OTHER): Payer: Medicare Other

## 2023-02-11 VITALS — Ht 61.0 in | Wt 150.0 lb

## 2023-02-11 DIAGNOSIS — Z0000 Encounter for general adult medical examination without abnormal findings: Secondary | ICD-10-CM | POA: Diagnosis not present

## 2023-02-11 NOTE — Progress Notes (Signed)
Subjective:   Lauren Stokes is a 77 y.o. female who presents for Medicare Annual (Subsequent) preventive examination.   I connected with  Lauren Stokes on 02/11/23 by a audio enabled telemedicine application and verified that I am speaking with the correct person using two identifiers.  Patient Location: Home  Provider Location: Home Office  I discussed the limitations of evaluation and management by telemedicine. The patient expressed understanding and agreed to proceed.  Objective:    Today's Vitals   02/11/23 1033  Weight: 150 lb (68 kg)  Height: '5\' 1"'$  (1.549 m)   Body mass index is 28.34 kg/m.     02/11/2023   10:36 AM 02/07/2022   11:24 AM  Advanced Directives  Does Patient Have a Medical Advance Directive? No No  Would patient like information on creating a medical advance directive? No - Patient declined No - Patient declined    Current Medications (verified) Outpatient Encounter Medications as of 02/11/2023  Medication Sig   acetaminophen (TYLENOL) 650 MG CR tablet Take 650 mg by mouth every 8 (eight) hours as needed for pain.   alendronate (FOSAMAX) 70 MG tablet TAKE 1 TABLET WEEKLY (TAKE WITH 8OZ OF WATER 30 MINUTES BEFORE BREAKFAST)   B[place]ia-Glucosamine-Vit D (OSTEO BI-FLEX ONE PER DAY PO) Take by mouth.   calcium citrate-vitamin D (CITRACAL+D) 315-200 MG-UNIT tablet Take 1 tablet by mouth daily.   cholecalciferol (VITAMIN D3) 25 MCG (1000 UNIT) tablet Take 1 tablet (1,000 Units total) by mouth daily.   clotrimazole-betamethasone (LOTRISONE) cream Apply 1 application topically 2 (two) times daily. To affected areas until rash clears   Cyanocobalamin (VITAMIN B 12 PO) Take 1 tablet by mouth daily.   diclofenac (VOLTAREN) 75 MG EC tablet TAKE ONE (1) TABLET BY MOUTH TWO (2) TIMES DAILY   DULoxetine (CYMBALTA) 60 MG capsule Take 1 capsule (60 mg total) by mouth daily.   fexofenadine-pseudoephedrine (ALLEGRA-D 24) 180-240 MG 24 hr tablet Take 1 tablet by mouth every  evening. For allergy and congestion   fluticasone (FLONASE) 50 MCG/ACT nasal spray Place 1 spray into both nostrils daily.   LORazepam (ATIVAN) 0.5 MG tablet TAKE ONE TABLET BY MOUTH AT BEDTIME   losartan-hydrochlorothiazide (HYZAAR) 50-12.5 MG tablet TAKE ONE (1) TABLET BY MOUTH EVERY DAY   montelukast (SINGULAIR) 10 MG tablet Take 1 tablet (10 mg total) by mouth daily.   omeprazole-sodium bicarbonate (ZEGERID) 40-1100 MG capsule TAKE 1 CAPSULE EVERY MORNING BEFORE BREAKFAST   No facility-administered encounter medications on file as of 02/11/2023.    Allergies (verified) Cefaclor, Penicillins, and Tetracyclines & related   History: Past Medical History:  Diagnosis Date   Allergy    Collagen vascular disease ([place])    Depression    Essential tremor    GERD (gastroesophageal reflux disease)    Hypertension    Past Surgical History:  Procedure Laterality Date   CHOLECYSTECTOMY     COSMETIC SURGERY     facial  (born deformed)    Family History  Problem Relation Age of Onset   Anxiety disorder Mother    Aneurysm Mother    Pneumonia Mother    Ulcers Mother    Cancer Father        brain tumor   Hearing loss Sister    Social History   Socioeconomic History   Marital status: Single    Spouse name: Not on file   Number of children: 0   Years of education: Not on file   Highest education level:  Not on file  Occupational History   Occupation: retired   Tobacco Use   Smoking status: Never   Smokeless tobacco: Never  Vaping Use   Vaping Use: Never used  Substance and Sexual Activity   Alcohol use: Never   Drug use: Never   Sexual activity: Not Currently  Other Topics Concern   Not on file  Social History Narrative   Lives with sister and brother-n-law    Social Determinants of Health   Financial Resource Strain: Low Risk  (02/11/2023)   Overall Financial Resource Strain (CARDIA)    Difficulty of Paying Living Expenses: Not hard at all  Food Insecurity: No [place] (02/11/2023)   Hunger Vital Sign    Worried About Running Out of Food in the Last Year: Never true    [place] in the Last Year: Never true  Transportation Needs: No Transportation Needs (02/11/2023)   PRAPARE - Hydrologist (Medical): No    Lack of Transportation (Non-Medical): No  Physical Activity: Insufficiently Active (02/11/2023)   Exercise Vital Sign    Days of Exercise per Week: 3 days    Minutes of Exercise per Session: 30 min  Stress: No Stress Concern Present (02/11/2023)   [place]    Feeling of Stress : Not at all  Social Connections: Unknown (02/11/2023)   Social Connection and Isolation Panel [NHANES]    Frequency of Communication with Friends and Family: More than three times a week    Frequency of Social Gatherings with Friends and Family: More than three times a week    Attends Religious Services: More than 4 times per year    Active Member of [employer] or Organizations: Yes    Attends Music therapist: More than 4 times per year    Marital Status: Patient refused    Tobacco Counseling Counseling given: Not Answered   Clinical Intake:  Pre-visit preparation completed: Yes  Pain : No/denies pain     Nutritional Risks: None Diabetes: No  How often do you need to have someone help you when you read instructions, pamphlets, or other written materials from your doctor or pharmacy?: 1 - Never  Diabetic?no   Interpreter Needed?: No  Information entered by :: Lauren Pierini, LPN   Activities of Daily Living    02/11/2023   10:36 AM  In your present state of health, do you have any difficulty performing the following activities:  Hearing? 0  Vision? 0  Difficulty concentrating or making decisions? 0  Walking or climbing stairs? 0  Dressing or bathing? 0  Doing errands, shopping? 0  Preparing Food and eating ? N  Using the Toilet? N  In the  past six months, have you accidently leaked urine? N  Do you have problems with loss of bowel control? N  Managing your Medications? N  Managing your Finances? N  Housekeeping or managing your Housekeeping? N    Patient Care Team: Claretta Fraise, MD as PCP - General (Family Medicine)  Indicate any recent Medical Services you may have received from other than [facility] providers in the past year (date may be approximate).     Assessment:   This is a routine wellness examination for Evalina.  Hearing/Vision screen Vision Screening - Comments:: Wears rx glasses - up to date with routine eye exams with  Dr.Lee   Dietary issues and exercise activities discussed: Current Exercise Habits: Home exercise  routine, Type of exercise: walking, Time (Minutes): 30, Frequency (Times/Week): 3, Weekly Exercise (Minutes/Week): 90, Intensity: Mild, Exercise limited by: None identified   Goals Addressed             This Visit's Progress    Exercise 3x per week (30 min per time)   On track    Encouraged chair exercises and stretching due to back issues.        Depression Screen    02/11/2023   10:35 AM 12/31/2022    3:07 PM 09/26/2022    1:38 PM 05/16/2022    1:27 PM 02/07/2022   11:21 AM 11/15/2021    1:46 PM 08/10/2021    2:06 PM  PHQ 2/9 Scores  PHQ - 2 Score 0 0 0 0 0 0 0  PHQ- 9 Score       0    Fall Risk    02/11/2023   10:34 AM 12/31/2022    3:07 PM 09/26/2022    1:38 PM 05/16/2022    1:27 PM 02/07/2022   11:25 AM  [place] in the past year? 0 0 0 0 0  Number falls in past yr: 0    0  Injury with Fall? 0    0  Risk for fall due to : No Fall Risks    Impaired balance/gait  Follow up Falls prevention discussed    Falls prevention discussed    FALL RISK PREVENTION PERTAINING TO THE HOME:  Any stairs in or around the home? Yes  If so, are there any without handrails? No  Home free of loose throw rugs in walkways, pet beds, electrical cords, etc? Yes  Adequate lighting in your  home to reduce risk of falls? Yes   ASSISTIVE DEVICES UTILIZED TO PREVENT FALLS:  Life alert? No  Use of a cane, walker or w/c? No  Grab bars in the bathroom? Yes  Shower chair or bench in shower? Yes  Elevated toilet seat or a handicapped toilet? Yes          02/11/2023   10:36 AM 02/07/2022   11:28 AM  6CIT Screen  What Year? 0 points 0 points  What month? 0 points 0 points  What time? 0 points 0 points  Count back from 20 0 points 0 points  Months in reverse 0 points 0 points  Repeat phrase 0 points 0 points  Total Score 0 points 0 points    Immunizations Immunization History  Administered Date(s) Administered   Fluad Quad(high Dose 65+) 09/07/2019, 09/16/2020, 09/25/2021, 09/26/2022   Influenza, High Dose Seasonal PF 10/04/2015, 09/12/2016, 09/26/2017, 09/23/2018   Influenza,trivalent, recombinat, inj, PF 09/20/2014   Moderna SARS-COV2 Booster Vaccination 11/23/2020   Moderna Sars-Covid-2 Vaccination 02/15/2020, 03/14/2020   Pneumococcal Conjugate-13 11/24/2015   Pneumococcal Polysaccharide-23 12/13/2016   Tdap 07/25/2015   Zoster Recombinat (Shingrix) 06/30/2018, 10/07/2018    TDAP status: Up to date  Flu Vaccine status: Up to date  Pneumococcal vaccine status: Up to date  Covid-19 vaccine status: Completed vaccines  Qualifies for Shingles Vaccine? Yes   Zostavax completed Yes   Shingrix Completed?: Yes  Screening Tests Health Maintenance  Topic Date Due   COVID-19 Vaccine (3 - Moderna risk series) 12/21/2020   Medicare Annual Wellness (AWV)  02/11/2024   DTaP/Tdap/Td (2 - Td or Tdap) 07/24/2025   Pneumonia Vaccine 56+ Years old  Completed   INFLUENZA VACCINE  Completed   DEXA SCAN  Completed   Hepatitis C Screening  Completed   Zoster Vaccines- Shingrix  Completed   HPV VACCINES  Aged Out   COLONOSCOPY (Pts 45-42yr Insurance coverage will need to be confirmed)  Discontinued    Health Maintenance  Health Maintenance Due  Topic Date Due    COVID-19 Vaccine (3 - Moderna risk series) 12/21/2020    Colorectal cancer screening: No longer required.   Mammogram status: No longer required due to age .  Bone Density status: Completed 11/17/2020. Results reflect: Bone density results: OSTEOPENIA. Repeat every 5 years.  Lung Cancer Screening: (Low Dose CT Chest recommended if Age 643-80years, 30 pack-year currently smoking OR have quit w/in 15years.) does not qualify.   Lung Cancer Screening Referral: n/a  Additional Screening:  Hepatitis C Screening: does not qualify; Completed 01/13/2020  Vision Screening: Recommended annual ophthalmology exams for early detection of glaucoma and other disorders of the eye. Is the patient up to date with their annual eye exam?  Yes  Who is the provider or what is the name of the office in which the patient attends annual eye exams? Dr.Lee  If pt is not established with a provider, would they like to be referred to a provider to establish care? No .   Dental Screening: Recommended annual dental exams for proper oral hygiene  Community Resource Referral / Chronic Care Management: CRR required this visit?  No   CCM required this visit?  No      Plan:     I have personally reviewed and noted the following in the patient's chart:   Medical and social history Use of alcohol, tobacco or illicit drugs  Current medications and supplements including opioid prescriptions. Patient is not currently taking opioid prescriptions. Functional ability and status Nutritional status Physical activity Advanced directives List of other physicians Hospitalizations, surgeries, and ER visits in previous 12 months Vitals Screenings to include cognitive, depression, and falls Referrals and appointments  In addition, I have reviewed and discussed with patient certain preventive protocols, quality metrics, and best practice recommendations. A written personalized care plan for preventive services as well as  general preventive health recommendations were provided to patient.     LDaphane Shepherd LPN   [id number]  Nurse Notes: none

## 2023-02-11 NOTE — Patient Instructions (Signed)
Lauren Stokes , Thank you for taking time to come for your Medicare Wellness Visit. I appreciate your ongoing commitment to your health goals. Please review the following plan we discussed and let me know if I can assist you in the future.   These are the goals we discussed:  Goals      Exercise 3x per week (30 min per time)     Encouraged chair exercises and stretching due to back issues.         This is a list of the screening recommended for you and due dates:  Health Maintenance  Topic Date Due   COVID-19 Vaccine (3 - Moderna risk series) 12/21/2020   Medicare Annual Wellness Visit  02/11/2024   DTaP/Tdap/Td vaccine (2 - Td or Tdap) 07/24/2025   Pneumonia Vaccine  Completed   Flu Shot  Completed   DEXA scan (bone density measurement)  Completed   Hepatitis C Screening: USPSTF Recommendation to screen - Ages 40-79 yo.  Completed   Zoster (Shingles) Vaccine  Completed   HPV Vaccine  Aged Out   Colon Cancer Screening  Discontinued    Advanced directives: Advance directive discussed with you today. I have provided a copy for you to complete at home and have notarized. Once this is complete please bring a copy in to our office so we can scan it into your chart.   Conditions/risks identified: Aim for 30 minutes of exercise or brisk walking, 6-8 glasses of water, and 5 servings of fruits and vegetables each day.   Next appointment: Follow up in one year for your annual wellness visit    Preventive Care 65 Years and Older, Female Preventive care refers to lifestyle choices and visits with your health care provider that can promote health and wellness. What does preventive care include? A yearly physical exam. This is also called an annual well check. Dental exams once or twice a year. Routine eye exams. Ask your health care provider how often you should have your eyes checked. Personal lifestyle choices, including: Daily care of your teeth and gums. Regular physical  activity. Eating a healthy diet. Avoiding tobacco and drug use. Limiting alcohol use. Practicing safe sex. Taking low-dose aspirin every day. Taking vitamin and mineral supplements as recommended by your health care provider. What happens during an annual well check? The services and screenings done by your health care provider during your annual well check will depend on your age, overall health, lifestyle risk factors, and family history of disease. Counseling  Your health care provider may ask you questions about your: Alcohol use. Tobacco use. Drug use. Emotional well-being. Home and relationship well-being. Sexual activity. Eating habits. History of falls. Memory and ability to understand (cognition). Work and work Statistician. Reproductive health. Screening  You may have the following tests or measurements: Height, weight, and BMI. Blood pressure. Lipid and cholesterol levels. These may be checked every 5 years, or more frequently if you are over 57 years old. Skin check. Lung cancer screening. You may have this screening every year starting at age 25 if you have a 30-pack-year history of smoking and currently smoke or have quit within the past 15 years. Fecal occult blood test (FOBT) of the stool. You may have this test every year starting at age 40. Flexible sigmoidoscopy or colonoscopy. You may have a sigmoidoscopy every 5 years or a colonoscopy every 10 years starting at age 54. Hepatitis C blood test. Hepatitis B blood test. Sexually transmitted disease (STD) testing. Diabetes  screening. This is done by checking your blood sugar (glucose) after you have not eaten for a while (fasting). You may have this done every 1-3 years. Bone density scan. This is done to screen for osteoporosis. You may have this done starting at age 61. Mammogram. This may be done every 1-2 years. Talk to your health care provider about how often you should have regular mammograms. Talk with your  health care provider about your test results, treatment options, and if necessary, the need for more tests. Vaccines  Your health care provider may recommend certain vaccines, such as: Influenza vaccine. This is recommended every year. Tetanus, diphtheria, and acellular pertussis (Tdap, Td) vaccine. You may need a Td booster every 10 years. Zoster vaccine. You may need this after age 27. Pneumococcal 13-valent conjugate (PCV13) vaccine. One dose is recommended after age 34. Pneumococcal polysaccharide (PPSV23) vaccine. One dose is recommended after age 28. Talk to your health care provider about which screenings and vaccines you need and how often you need them. This information is not intended to replace advice given to you by your health care provider. Make sure you discuss any questions you have with your health care provider. Document Released: 12/23/2015 Document Revised: 08/15/2016 Document Reviewed: 09/27/2015 Elsevier Interactive Patient Education  2017 [employer] Prevention in the Home Falls can cause injuries. They can happen to people of all ages. There are many things you can do to make your home safe and to help prevent falls. What can I do on the outside of my home? Regularly fix the edges of walkways and driveways and fix any cracks. Remove anything that might make you trip as you walk through a door, such as a raised step or threshold. Trim any bushes or trees on the path to your home. Use bright outdoor lighting. Clear any walking paths of anything that might make someone trip, such as rocks or tools. Regularly check to see if handrails are loose or broken. Make sure that both sides of any steps have handrails. Any raised decks and porches should have guardrails on the edges. Have any leaves, snow, or ice cleared regularly. Use sand or salt on walking paths during winter. Clean up any spills in your garage right away. This includes oil or grease spills. What can I  do in the bathroom? Use night lights. Install grab bars by the toilet and in the tub and shower. Do not use towel bars as grab bars. Use non-skid mats or decals in the tub or shower. If you need to sit down in the shower, use a plastic, non-slip stool. Keep the floor dry. Clean up any water that spills on the floor as soon as it happens. Remove soap buildup in the tub or shower regularly. Attach bath mats securely with double-sided non-slip rug tape. Do not have throw rugs and other things on the floor that can make you trip. What can I do in the bedroom? Use night lights. Make sure that you have a light by your bed that is easy to reach. Do not use any sheets or blankets that are too big for your bed. They should not hang down onto the floor. Have a firm chair that has side arms. You can use this for support while you get dressed. Do not have throw rugs and other things on the floor that can make you trip. What can I do in the kitchen? Clean up any spills right away. Avoid walking on wet floors. Keep  items that you use a lot in easy-to-reach places. If you need to reach something above you, use a strong step stool that has a grab bar. Keep electrical cords out of the way. Do not use floor polish or wax that makes floors slippery. If you must use wax, use non-skid floor wax. Do not have throw rugs and other things on the floor that can make you trip. What can I do with my stairs? Do not leave any items on the stairs. Make sure that there are handrails on both sides of the stairs and use them. Fix handrails that are broken or loose. Make sure that handrails are as long as the stairways. Check any carpeting to make sure that it is firmly attached to the stairs. Fix any carpet that is loose or worn. Avoid having throw rugs at the top or bottom of the stairs. If you do have throw rugs, attach them to the floor with carpet tape. Make sure that you have a light switch at the top of the stairs  and the bottom of the stairs. If you do not have them, ask someone to add them for you. What else can I do to help prevent falls? Wear shoes that: Do not have high heels. Have rubber bottoms. Are comfortable and fit you well. Are closed at the toe. Do not wear sandals. If you use a stepladder: Make sure that it is fully opened. Do not climb a closed stepladder. Make sure that both sides of the stepladder are locked into place. Ask someone to hold it for you, if possible. Clearly mark and make sure that you can see: Any grab bars or handrails. First and last steps. Where the edge of each step is. Use tools that help you move around (mobility aids) if they are needed. These include: Canes. Walkers. Scooters. Crutches. Turn on the lights when you go into a dark area. Replace any light bulbs as soon as they burn out. Set up your furniture so you have a clear path. Avoid moving your furniture around. If any of your floors are uneven, fix them. If there are any pets around you, be aware of where they are. Review your medicines with your doctor. Some medicines can make you feel dizzy. This can increase your chance of falling. Ask your doctor what other things that you can do to help prevent falls. This information is not intended to replace advice given to you by your health care provider. Make sure you discuss any questions you have with your health care provider. Document Released: 09/22/2009 Document Revised: 05/03/2016 Document Reviewed: 12/31/2014 Elsevier Interactive Patient Education  2017 [employer].

## 2023-02-22 ENCOUNTER — Other Ambulatory Visit: Payer: Self-pay | Admitting: Family Medicine

## 2023-03-12 DIAGNOSIS — M79676 Pain in unspecified toe(s): Secondary | ICD-10-CM | POA: Diagnosis not present

## 2023-03-12 DIAGNOSIS — B351 Tinea unguium: Secondary | ICD-10-CM | POA: Diagnosis not present

## 2023-03-14 ENCOUNTER — Other Ambulatory Visit: Payer: Self-pay | Admitting: Family Medicine

## 2023-03-14 DIAGNOSIS — M1710 Unilateral primary osteoarthritis, unspecified knee: Secondary | ICD-10-CM

## 2023-04-01 ENCOUNTER — Other Ambulatory Visit: Payer: Self-pay | Admitting: Family Medicine

## 2023-04-01 DIAGNOSIS — J3089 Other allergic rhinitis: Secondary | ICD-10-CM

## 2023-06-04 DIAGNOSIS — K080 Exfoliation of teeth due to systemic causes: Secondary | ICD-10-CM | POA: Diagnosis not present

## 2023-06-06 ENCOUNTER — Other Ambulatory Visit: Payer: Self-pay | Admitting: Family Medicine

## 2023-06-06 DIAGNOSIS — J3089 Other allergic rhinitis: Secondary | ICD-10-CM

## 2023-06-18 DIAGNOSIS — M79676 Pain in unspecified toe(s): Secondary | ICD-10-CM | POA: Diagnosis not present

## 2023-06-18 DIAGNOSIS — B351 Tinea unguium: Secondary | ICD-10-CM | POA: Diagnosis not present

## 2023-06-21 ENCOUNTER — Other Ambulatory Visit: Payer: Self-pay | Admitting: *Deleted

## 2023-06-21 ENCOUNTER — Telehealth: Payer: Self-pay | Admitting: Family Medicine

## 2023-06-21 DIAGNOSIS — Z13220 Encounter for screening for lipoid disorders: Secondary | ICD-10-CM

## 2023-06-21 DIAGNOSIS — I10 Essential (primary) hypertension: Secondary | ICD-10-CM

## 2023-06-21 NOTE — Telephone Encounter (Signed)
LAB ORDERS ENTERED

## 2023-06-21 NOTE — Telephone Encounter (Signed)
Patient has appt with PCP on 7/23 and is scheduled to have lab work on 7/17, please add orders

## 2023-06-25 ENCOUNTER — Other Ambulatory Visit: Payer: Self-pay | Admitting: Family Medicine

## 2023-06-25 DIAGNOSIS — F411 Generalized anxiety disorder: Secondary | ICD-10-CM

## 2023-06-25 DIAGNOSIS — F1320 Sedative, hypnotic or anxiolytic dependence, uncomplicated: Secondary | ICD-10-CM

## 2023-06-25 DIAGNOSIS — M1710 Unilateral primary osteoarthritis, unspecified knee: Secondary | ICD-10-CM

## 2023-06-26 ENCOUNTER — Other Ambulatory Visit: Payer: Medicare Other

## 2023-06-26 DIAGNOSIS — Z13220 Encounter for screening for lipoid disorders: Secondary | ICD-10-CM | POA: Diagnosis not present

## 2023-06-26 DIAGNOSIS — I10 Essential (primary) hypertension: Secondary | ICD-10-CM

## 2023-06-26 DIAGNOSIS — R739 Hyperglycemia, unspecified: Secondary | ICD-10-CM | POA: Diagnosis not present

## 2023-06-27 LAB — CMP14+EGFR
ALT: 37 IU/L — ABNORMAL HIGH (ref 0–32)
AST: 32 IU/L (ref 0–40)
Albumin: 4.3 g/dL (ref 3.8–4.8)
Alkaline Phosphatase: 88 IU/L (ref 44–121)
BUN/Creatinine Ratio: 34 — ABNORMAL HIGH (ref 12–28)
BUN: 28 mg/dL — ABNORMAL HIGH (ref 8–27)
Bilirubin Total: 0.4 mg/dL (ref 0.0–1.2)
CO2: 23 mmol/L (ref 20–29)
Calcium: 9.4 mg/dL (ref 8.7–10.3)
Chloride: 101 mmol/L (ref 96–106)
Creatinine, Ser: 0.83 mg/dL (ref 0.57–1.00)
Globulin, Total: 2.6 g/dL (ref 1.5–4.5)
Glucose: 153 mg/dL — ABNORMAL HIGH (ref 70–99)
Potassium: 4 mmol/L (ref 3.5–5.2)
Sodium: 141 mmol/L (ref 134–144)
Total Protein: 6.9 g/dL (ref 6.0–8.5)
eGFR: 73 mL/min/{1.73_m2} (ref >59)

## 2023-06-27 LAB — CBC WITH DIFFERENTIAL/PLATELET
Basophils Absolute: 0 10*3/uL (ref 0.0–0.2)
Basos: 0 %
EOS (ABSOLUTE): 0.1 10*3/uL (ref 0.0–0.4)
Eos: 2 %
Hematocrit: 37.5 % (ref 34.0–46.6)
Hemoglobin: 12.6 g/dL (ref 11.1–15.9)
Immature Grans (Abs): 0 10*3/uL (ref 0.0–0.1)
Immature Granulocytes: 0 %
Lymphocytes Absolute: 2.2 10*3/uL (ref 0.7–3.1)
Lymphs: 28 %
MCH: 30.2 pg (ref 26.6–33.0)
MCHC: 33.6 g/dL (ref 31.5–35.7)
MCV: 90 fL (ref 79–97)
Monocytes Absolute: 0.7 10*3/uL (ref 0.1–0.9)
Monocytes: 9 %
Neutrophils Absolute: 4.8 10*3/uL (ref 1.4–7.0)
Neutrophils: 61 %
Platelets: 321 10*3/uL (ref 150–450)
RBC: 4.17 x10E6/uL (ref 3.77–5.28)
RDW: 12.2 % (ref 11.7–15.4)
WBC: 7.8 10*3/uL (ref 3.4–10.8)

## 2023-06-27 LAB — LIPID PANEL
Chol/HDL Ratio: 2.2 ratio (ref 0.0–4.4)
Cholesterol, Total: 122 mg/dL (ref 100–199)
HDL: 55 mg/dL (ref >39)
LDL Chol Calc (NIH): 53 mg/dL (ref 0–99)
Triglycerides: 70 mg/dL (ref 0–149)
VLDL Cholesterol Cal: 14 mg/dL (ref 5–40)

## 2023-06-28 LAB — HGB A1C W/O EAG: Hgb A1c MFr Bld: 6.1 % — ABNORMAL HIGH (ref 4.8–5.6)

## 2023-06-28 LAB — SPECIMEN STATUS REPORT

## 2023-07-02 ENCOUNTER — Encounter: Payer: Self-pay | Admitting: Family Medicine

## 2023-07-02 ENCOUNTER — Ambulatory Visit (INDEPENDENT_AMBULATORY_CARE_PROVIDER_SITE_OTHER): Payer: Medicare Other | Admitting: Family Medicine

## 2023-07-02 VITALS — BP 122/71 | HR 84 | Temp 97.5°F | Ht 61.0 in | Wt 149.0 lb

## 2023-07-02 DIAGNOSIS — F1320 Sedative, hypnotic or anxiolytic dependence, uncomplicated: Secondary | ICD-10-CM

## 2023-07-02 DIAGNOSIS — R7303 Prediabetes: Secondary | ICD-10-CM

## 2023-07-02 DIAGNOSIS — M1710 Unilateral primary osteoarthritis, unspecified knee: Secondary | ICD-10-CM

## 2023-07-02 DIAGNOSIS — I10 Essential (primary) hypertension: Secondary | ICD-10-CM | POA: Diagnosis not present

## 2023-07-02 DIAGNOSIS — F411 Generalized anxiety disorder: Secondary | ICD-10-CM

## 2023-07-02 DIAGNOSIS — K2100 Gastro-esophageal reflux disease with esophagitis, without bleeding: Secondary | ICD-10-CM

## 2023-07-02 DIAGNOSIS — Z13220 Encounter for screening for lipoid disorders: Secondary | ICD-10-CM

## 2023-07-02 MED ORDER — OMEPRAZOLE-SODIUM BICARBONATE 40-1100 MG PO CAPS
ORAL_CAPSULE | ORAL | 1 refills | Status: DC
Start: 2023-07-02 — End: 2023-12-18

## 2023-07-02 MED ORDER — LOSARTAN POTASSIUM-HCTZ 50-12.5 MG PO TABS
ORAL_TABLET | ORAL | 2 refills | Status: DC
Start: 2023-07-02 — End: 2024-01-21

## 2023-07-02 MED ORDER — ALENDRONATE SODIUM 70 MG PO TABS: ORAL_TABLET | ORAL | 3 refills | Status: DC

## 2023-07-02 MED ORDER — LORAZEPAM 0.5 MG PO TABS
0.5000 mg | ORAL_TABLET | Freq: Every day | ORAL | 0 refills | Status: DC
Start: 2023-07-02 — End: 2023-10-07

## 2023-07-02 MED ORDER — DICLOFENAC SODIUM 75 MG PO TBEC
75.0000 mg | DELAYED_RELEASE_TABLET | Freq: Two times a day (BID) | ORAL | 3 refills | Status: DC
Start: 2023-07-02 — End: 2023-12-30

## 2023-07-02 MED ORDER — DULOXETINE HCL 60 MG PO CPEP: 60.0000 mg | ORAL_CAPSULE | Freq: Every day | ORAL | 3 refills | Status: DC

## 2023-07-02 NOTE — Progress Notes (Signed)
Subjective:  Patient ID: Lauren Stokes, female    DOB: 1946/08/23  Age: 77 y.o. MRN: [id number]  CC: Medical Management of Chronic Issues   HPI MARIGRACE MCCOLE presents for  follow-up of hypertension. Patient has no history of headache chest pain or shortness of breath or recent cough. Patient also denies symptoms of TIA such as focal numbness or weakness. Patient denies side effects from medication. States taking it regularly.     08/10/2021    2:06 PM 05/16/2021    2:03 PM 11/15/2020    9:35 AM 05/12/2020    2:00 PM  GAD 7 : Generalized Anxiety Score  Nervous, Anxious, on Edge 0 0 0 0  Control/stop worrying 0 0 0 0  Worry too much - different things 0 1 0 0  Trouble relaxing 0 0 0 0  Restless 0 0 0 0  Easily annoyed or irritable 0 1 0 0  Afraid - awful might happen 0 0 0 0  Total GAD 7 Score 0 2 0 0  Anxiety Difficulty Not difficult at all Not difficult at all  Somewhat difficult       07/02/2023    3:04 PM 02/11/2023   10:35 AM 12/31/2022    3:07 PM 09/26/2022    1:38 PM 05/16/2022    1:27 PM  Depression screen PHQ 2/9  Decreased Interest 0 0 0 0 0  Down, Depressed, Hopeless 0 0 0 0 0  PHQ - 2 Score 0 0 0 0 0      History Beaulah has a past medical history of Allergy, Collagen vascular disease (HCC), Depression, Essential tremor, GERD (gastroesophageal reflux disease), and Hypertension.   She has a past surgical history that includes Cosmetic surgery and Cholecystectomy.   Her family history includes Aneurysm in her mother; Anxiety disorder in her mother; Cancer in her father; Hearing loss in her sister; Pneumonia in her mother; Ulcers in her mother.She reports that she has never smoked. She has never used smokeless tobacco. She reports that she does not drink alcohol and does not use drugs.  Current Outpatient Medications on File Prior to Visit  Medication Sig Dispense Refill   acetaminophen (TYLENOL) 650 MG CR tablet Take 650 mg by mouth every 8 (eight) hours as needed for  pain.     Boswellia-Glucosamine-Vit D (OSTEO BI-FLEX ONE PER DAY PO) Take by mouth.     calcium citrate-vitamin D (CITRACAL+D) 315-200 MG-UNIT tablet Take 1 tablet by mouth daily.     cholecalciferol (VITAMIN D3) 25 MCG (1000 UNIT) tablet Take 1 tablet (1,000 Units total) by mouth daily. 90 tablet 2   clotrimazole-betamethasone (LOTRISONE) cream Apply 1 application topically 2 (two) times daily. To affected areas until rash clears 45 g 1   Cyanocobalamin (VITAMIN B 12 PO) Take 1 tablet by mouth daily.     fexofenadine-pseudoephedrine (ALLEGRA-D 24) 180-240 MG 24 hr tablet Take 1 tablet by mouth every evening. For allergy and congestion 30 tablet 11   fluticasone (FLONASE) 50 MCG/ACT nasal spray USE 1 TO 2 SPRAYS IN EACH NOSTRIL DAILY 16 g 2   montelukast (SINGULAIR) 10 MG tablet TAKE ONE (1) TABLET BY MOUTH EVERY DAY 90 tablet 1   No current facility-administered medications on file prior to visit.    ROS Review of Systems  Constitutional: Negative.   HENT: Negative.    Eyes:  Negative for visual disturbance.  Respiratory:  Negative for shortness of breath.   Cardiovascular:  Negative for chest pain.  Gastrointestinal:  Negative for abdominal pain.  Musculoskeletal:  Negative for arthralgias.    Objective:  BP 122/71   Pulse 84   Temp (!) 97.5 F (36.4 C)   Ht 5\' 1"  (1.549 m)   Wt 149 lb (67.6 kg)   SpO2 95%   BMI 28.15 kg/m   BP Readings from Last 3 Encounters:  07/02/23 122/71  01/10/23 118/68  12/31/22 (!) 152/76    Wt Readings from Last 3 Encounters:  07/02/23 149 lb (67.6 kg)  02/11/23 150 lb (68 kg)  12/31/22 150 lb 6.4 oz (68.2 kg)     Physical Exam Constitutional:      General: She is not in acute distress.    Appearance: She is well-developed.  Cardiovascular:     Rate and Rhythm: Normal rate and regular rhythm.  Pulmonary:     Breath sounds: Normal breath sounds.  Musculoskeletal:        General: Normal range of motion.  Skin:    General: Skin is  warm and dry.  Neurological:     Mental Status: She is alert and oriented to person, place, and time.       Assessment & Plan:   Zada was seen today for medical management of chronic issues.  Diagnoses and all orders for this visit:  Essential hypertension -     Cancel: CBC with Differential/Platelet -     Cancel: CMP14+EGFR -     losartan-hydrochlorothiazide (HYZAAR) 50-12.5 MG tablet; TAKE ONE (1) TABLET BY MOUTH EVERY DAY  Screening, lipid -     Cancel: Lipid panel  Prediabetes -     Cancel: Bayer DCA Hb A1c Waived  GAD (generalized anxiety disorder) -     LORazepam (ATIVAN) 0.5 MG tablet; Take 1 tablet (0.5 mg total) by mouth at bedtime.  Benzodiazepine dependence (HCC) -     LORazepam (ATIVAN) 0.5 MG tablet; Take 1 tablet (0.5 mg total) by mouth at bedtime.  Arthritis of knee -     diclofenac (VOLTAREN) 75 MG EC tablet; Take 1 tablet (75 mg total) by mouth 2 (two) times daily.  Gastroesophageal reflux disease with esophagitis, unspecified whether hemorrhage -     omeprazole-sodium bicarbonate (ZEGERID) 40-1100 MG capsule; TAKE 1 CAPSULE EVERY MORNING BEFORE BREAKFAST  Other orders -     alendronate (FOSAMAX) 70 MG tablet; TAKE 1 TABLET WEEKLY (TAKE WITH 8OZ OF WATER 30 MINUTES BEFORE BREAKFAST) -     DULoxetine (CYMBALTA) 60 MG capsule; Take 1 capsule (60 mg total) by mouth daily.   Allergies as of 07/02/2023       Reactions   Cefaclor Nausea And Vomiting   Penicillins Rash   Tetracyclines & Related Rash   vomiting        Medication List        Accurate as of July 02, 2023  3:33 PM. If you have any questions, ask your nurse or doctor.          acetaminophen 650 MG CR tablet Commonly known as: TYLENOL Take 650 mg by mouth every 8 (eight) hours as needed for pain.   alendronate 70 MG tablet Commonly known as: FOSAMAX TAKE 1 TABLET WEEKLY (TAKE WITH 8OZ OF WATER 30 MINUTES BEFORE BREAKFAST)   calcium citrate-vitamin D 315-200 MG-UNIT  tablet Commonly known as: CITRACAL+D Take 1 tablet by mouth daily.   cholecalciferol 25 MCG (1000 UNIT) tablet Commonly known as: VITAMIN D3 Take 1 tablet (1,000 Units total) by mouth daily.   clotrimazole-betamethasone cream  Commonly known as: Lotrisone Apply 1 application topically 2 (two) times daily. To affected areas until rash clears   diclofenac 75 MG EC tablet Commonly known as: VOLTAREN Take 1 tablet (75 mg total) by mouth 2 (two) times daily. What changed: See the new instructions. Changed by: Kriston Mckinnie   DULoxetine 60 MG capsule Commonly known as: Cymbalta Take 1 capsule (60 mg total) by mouth daily.   fexofenadine-pseudoephedrine 180-240 MG 24 hr tablet Commonly known as: ALLEGRA-D 24 Take 1 tablet by mouth every evening. For allergy and congestion   fluticasone 50 MCG/ACT nasal spray Commonly known as: FLONASE USE 1 TO 2 SPRAYS IN EACH NOSTRIL DAILY   LORazepam 0.5 MG tablet Commonly known as: ATIVAN Take 1 tablet (0.5 mg total) by mouth at bedtime.   losartan-hydrochlorothiazide 50-12.5 MG tablet Commonly known as: HYZAAR TAKE ONE (1) TABLET BY MOUTH EVERY DAY   montelukast 10 MG tablet Commonly known as: SINGULAIR TAKE ONE (1) TABLET BY MOUTH EVERY DAY   omeprazole-sodium bicarbonate 40-1100 MG capsule Commonly known as: ZEGERID TAKE 1 CAPSULE EVERY MORNING BEFORE BREAKFAST   OSTEO BI-FLEX ONE PER DAY PO Take by mouth.   VITAMIN B 12 PO Take 1 tablet by mouth daily.        Meds ordered this encounter  Medications   LORazepam (ATIVAN) 0.5 MG tablet    Sig: Take 1 tablet (0.5 mg total) by mouth at bedtime.    Dispense:  90 tablet    Refill:  0   alendronate (FOSAMAX) 70 MG tablet    Sig: TAKE 1 TABLET WEEKLY (TAKE WITH 8OZ OF WATER 30 MINUTES BEFORE BREAKFAST)    Dispense:  13 tablet    Refill:  3   diclofenac (VOLTAREN) 75 MG EC tablet    Sig: Take 1 tablet (75 mg total) by mouth 2 (two) times daily.    Dispense:  180 tablet     Refill:  3   DULoxetine (CYMBALTA) 60 MG capsule    Sig: Take 1 capsule (60 mg total) by mouth daily.    Dispense:  90 capsule    Refill:  3   losartan-hydrochlorothiazide (HYZAAR) 50-12.5 MG tablet    Sig: TAKE ONE (1) TABLET BY MOUTH EVERY DAY    Dispense:  90 tablet    Refill:  2   omeprazole-sodium bicarbonate (ZEGERID) 40-1100 MG capsule    Sig: TAKE 1 CAPSULE EVERY MORNING BEFORE BREAKFAST    Dispense:  90 capsule    Refill:  1      Follow-up: Return in about 6 months (around 01/02/2024).  Mechele Claude, M.D.

## 2023-08-10 ENCOUNTER — Other Ambulatory Visit: Payer: Self-pay | Admitting: Family Medicine

## 2023-09-04 ENCOUNTER — Ambulatory Visit (INDEPENDENT_AMBULATORY_CARE_PROVIDER_SITE_OTHER): Payer: Medicare Other

## 2023-09-04 DIAGNOSIS — Z23 Encounter for immunization: Secondary | ICD-10-CM | POA: Diagnosis not present

## 2023-09-10 DIAGNOSIS — M25572 Pain in left ankle and joints of left foot: Secondary | ICD-10-CM | POA: Diagnosis not present

## 2023-09-10 DIAGNOSIS — M79672 Pain in left foot: Secondary | ICD-10-CM | POA: Diagnosis not present

## 2023-09-24 DIAGNOSIS — M25572 Pain in left ankle and joints of left foot: Secondary | ICD-10-CM | POA: Diagnosis not present

## 2023-09-24 DIAGNOSIS — M79672 Pain in left foot: Secondary | ICD-10-CM | POA: Diagnosis not present

## 2023-10-01 DIAGNOSIS — H2513 Age-related nuclear cataract, bilateral: Secondary | ICD-10-CM | POA: Diagnosis not present

## 2023-10-01 DIAGNOSIS — H524 Presbyopia: Secondary | ICD-10-CM | POA: Diagnosis not present

## 2023-10-01 DIAGNOSIS — H40033 Anatomical narrow angle, bilateral: Secondary | ICD-10-CM | POA: Diagnosis not present

## 2023-10-05 ENCOUNTER — Other Ambulatory Visit: Payer: Self-pay | Admitting: Family Medicine

## 2023-10-05 DIAGNOSIS — F411 Generalized anxiety disorder: Secondary | ICD-10-CM

## 2023-10-05 DIAGNOSIS — F1320 Sedative, hypnotic or anxiolytic dependence, uncomplicated: Secondary | ICD-10-CM

## 2023-10-15 DIAGNOSIS — M79672 Pain in left foot: Secondary | ICD-10-CM | POA: Diagnosis not present

## 2023-10-15 DIAGNOSIS — M25572 Pain in left ankle and joints of left foot: Secondary | ICD-10-CM | POA: Diagnosis not present

## 2023-11-05 ENCOUNTER — Other Ambulatory Visit: Payer: Self-pay | Admitting: Family Medicine

## 2023-11-05 DIAGNOSIS — J3089 Other allergic rhinitis: Secondary | ICD-10-CM

## 2023-12-12 DIAGNOSIS — K080 Exfoliation of teeth due to systemic causes: Secondary | ICD-10-CM | POA: Diagnosis not present

## 2023-12-18 ENCOUNTER — Other Ambulatory Visit: Payer: Self-pay | Admitting: Family Medicine

## 2023-12-18 ENCOUNTER — Encounter: Payer: Self-pay | Admitting: *Deleted

## 2023-12-18 DIAGNOSIS — K2100 Gastro-esophageal reflux disease with esophagitis, without bleeding: Secondary | ICD-10-CM

## 2023-12-25 ENCOUNTER — Other Ambulatory Visit: Payer: Self-pay | Admitting: Family Medicine

## 2023-12-25 DIAGNOSIS — M21372 Foot drop, left foot: Secondary | ICD-10-CM | POA: Diagnosis not present

## 2023-12-25 DIAGNOSIS — K2100 Gastro-esophageal reflux disease with esophagitis, without bleeding: Secondary | ICD-10-CM

## 2023-12-25 NOTE — Telephone Encounter (Signed)
 Copied from CRM [id number]. Topic: Clinical - Medication Refill >> Dec 25, 2023 10:36 AM Felizardo Hotter wrote: Most Recent Primary Care Visit:  Provider: POTTER, JANICE K  Department: [facility]  Visit Type: FLU SHOT  Date: 09/04/2023  Medication: ***  Has the patient contacted their pharmacy?  (Agent: If no, request that the patient contact the pharmacy for the refill. If patient does not wish to contact the pharmacy document the reason why and proceed with request.) (Agent: If yes, when and what did the pharmacy advise?)  Is this the correct pharmacy for this prescription?  If no, delete pharmacy and type the correct one.  This is the patient's preferred pharmacy:  [facility],  - [address] [place] [address] Phone: [phone] Fax: [phone]   Has the prescription been filled recently?   Is the patient out of the medication?   Has the patient been seen for an appointment in the last year OR does the patient have an upcoming appointment?   Can we respond through MyChart?   Agent: Please be advised that Rx refills may take up to 3 business days. We ask that you follow-up with your pharmacy.

## 2023-12-30 ENCOUNTER — Telehealth: Payer: Self-pay | Admitting: Family Medicine

## 2023-12-30 ENCOUNTER — Other Ambulatory Visit: Payer: Self-pay | Admitting: Family Medicine

## 2023-12-30 ENCOUNTER — Other Ambulatory Visit (HOSPITAL_COMMUNITY): Payer: Self-pay

## 2023-12-30 DIAGNOSIS — M1710 Unilateral primary osteoarthritis, unspecified knee: Secondary | ICD-10-CM

## 2023-12-30 DIAGNOSIS — F1320 Sedative, hypnotic or anxiolytic dependence, uncomplicated: Secondary | ICD-10-CM

## 2023-12-30 DIAGNOSIS — F411 Generalized anxiety disorder: Secondary | ICD-10-CM

## 2023-12-30 MED ORDER — DICLOFENAC SODIUM 75 MG PO TBEC: 75.0000 mg | DELAYED_RELEASE_TABLET | Freq: Two times a day (BID) | ORAL | 3 refills | Status: AC

## 2023-12-30 NOTE — Telephone Encounter (Unsigned)
Copied from CRM [id number]. Topic: Clinical - Prescription Issue >> Dec 30, 2023 11:11 AM Geroge Baseman wrote: Reason for CRM: Patient is calling because pharmacy states she needs a prior auth for omeprazole-sodium bicarbonate (ZEGERID) 40-1100 MG capsule.

## 2023-12-30 NOTE — Telephone Encounter (Signed)
Copied from CRM [id number]. Topic: Clinical - Medication Refill >> Dec 30, 2023 11:12 AM Geroge Baseman wrote: Most Recent Primary Care Visit:  Provider: Quay Burow  Department: [facility]  Visit Type: FLU SHOT  Date: 09/04/2023  Medication: LORazepam (ATIVAN) 0.5 MG tablet, diclofenac (VOLTAREN) 75 MG EC tablet  Has the patient contacted their pharmacy? Yes Told her she needed to call the office for a refill  Is this the correct pharmacy for this prescription? Yes If no, delete pharmacy and type the correct one.  This is the patient's preferred pharmacy:  [facility], [place] - [address] [place] [address] Phone: [phone] Fax: [phone]   Has the prescription been filled recently? No  Is the patient out of the medication? Yes the diclofenac, almost out of lorazepam  Has the patient been seen for an appointment in the last year OR does the patient have an upcoming appointment? Yes  Can we respond through MyChart? No, please call  Agent: Please be advised that Rx refills may take up to 3 business days. We ask that you follow-up with your pharmacy.

## 2023-12-30 NOTE — Telephone Encounter (Signed)
Copied from CRM [id number]. Topic: Clinical - Medication Refill >> Dec 30, 2023 11:12 AM Geroge Baseman wrote: Most Recent Primary Care Visit:  Provider: Quay Burow  Department: [facility]  Visit Type: FLU SHOT  Date: 09/04/2023  Medication: LORazepam (ATIVAN) 0.5 MG tablet,   Has the patient contacted their pharmacy?  (Agent: If no, request that the patient contact the pharmacy for the refill. If patient does not wish to contact the pharmacy document the reason why and proceed with request.) (Agent: If yes, when and what did the pharmacy advise?)  Is this the correct pharmacy for this prescription?  If no, delete pharmacy and type the correct one.  This is the patient's preferred pharmacy:  [facility], [place] - [address] [place] [address] Phone: [phone] Fax: [phone]   Has the prescription been filled recently?   Is the patient out of the medication?   Has the patient been seen for an appointment in the last year OR does the patient have an upcoming appointment?   Can we respond through MyChart?   Agent: Please be advised that Rx refills may take up to 3 business days. We ask that you follow-up with your pharmacy.

## 2024-01-02 ENCOUNTER — Ambulatory Visit: Payer: Medicare Other | Admitting: Family Medicine

## 2024-01-03 ENCOUNTER — Ambulatory Visit: Payer: Medicare Other | Admitting: Family Medicine

## 2024-01-06 ENCOUNTER — Other Ambulatory Visit (HOSPITAL_COMMUNITY): Payer: Self-pay

## 2024-01-07 DIAGNOSIS — M79676 Pain in unspecified toe(s): Secondary | ICD-10-CM | POA: Diagnosis not present

## 2024-01-07 DIAGNOSIS — B351 Tinea unguium: Secondary | ICD-10-CM | POA: Diagnosis not present

## 2024-01-13 ENCOUNTER — Ambulatory Visit: Payer: Medicare Other | Admitting: Family Medicine

## 2024-01-14 ENCOUNTER — Telehealth: Payer: Self-pay | Admitting: Family Medicine

## 2024-01-14 NOTE — Telephone Encounter (Signed)
 Copied from CRM [id number]. Topic: Clinical - Request for Lab/Test Order >> Jan 14, 2024  4:04 PM Lauren Stokes wrote: Reason for CRM: Patient wants to get labs done before appointment, please call [phone]

## 2024-01-15 NOTE — Telephone Encounter (Signed)
 Pty scheduled for lab and orders are pending.

## 2024-01-17 ENCOUNTER — Other Ambulatory Visit: Payer: Medicare Other

## 2024-01-17 ENCOUNTER — Telehealth: Payer: Self-pay | Admitting: Family Medicine

## 2024-01-17 DIAGNOSIS — F1320 Sedative, hypnotic or anxiolytic dependence, uncomplicated: Secondary | ICD-10-CM | POA: Diagnosis not present

## 2024-01-17 DIAGNOSIS — R7303 Prediabetes: Secondary | ICD-10-CM

## 2024-01-17 DIAGNOSIS — I10 Essential (primary) hypertension: Secondary | ICD-10-CM

## 2024-01-17 DIAGNOSIS — Z13220 Encounter for screening for lipoid disorders: Secondary | ICD-10-CM | POA: Diagnosis not present

## 2024-01-17 DIAGNOSIS — Z79899 Other long term (current) drug therapy: Secondary | ICD-10-CM

## 2024-01-17 LAB — BAYER DCA HB A1C WAIVED: HB A1C (BAYER DCA - WAIVED): 6.3 % — ABNORMAL HIGH (ref 4.8–5.6)

## 2024-01-17 LAB — LIPID PANEL

## 2024-01-18 LAB — CMP14+EGFR
ALT: 33 IU/L — ABNORMAL HIGH (ref 0–32)
AST: 30 IU/L (ref 0–40)
Albumin: 4.3 g/dL (ref 3.8–4.8)
Alkaline Phosphatase: 86 IU/L (ref 44–121)
BUN/Creatinine Ratio: 33 — ABNORMAL HIGH (ref 12–28)
BUN: 28 mg/dL — ABNORMAL HIGH (ref 8–27)
Bilirubin Total: 0.3 mg/dL (ref 0.0–1.2)
CO2: 19 mmol/L — ABNORMAL LOW (ref 20–29)
Calcium: 9.5 mg/dL (ref 8.7–10.3)
Chloride: 103 mmol/L (ref 96–106)
Creatinine, Ser: 0.85 mg/dL (ref 0.57–1.00)
Globulin, Total: 3 g/dL (ref 1.5–4.5)
Glucose: 85 mg/dL (ref 70–99)
Potassium: 4.2 mmol/L (ref 3.5–5.2)
Sodium: 142 mmol/L (ref 134–144)
Total Protein: 7.3 g/dL (ref 6.0–8.5)
eGFR: 71 mL/min/{1.73_m2} (ref >59)

## 2024-01-18 LAB — CBC WITH DIFFERENTIAL/PLATELET
Basophils Absolute: 0.1 10*3/uL (ref 0.0–0.2)
Basos: 1 %
EOS (ABSOLUTE): 0.3 10*3/uL (ref 0.0–0.4)
Eos: 3 %
Hematocrit: 41 % (ref 34.0–46.6)
Hemoglobin: 13.4 g/dL (ref 11.1–15.9)
Immature Grans (Abs): 0 10*3/uL (ref 0.0–0.1)
Immature Granulocytes: 0 %
Lymphocytes Absolute: 2.7 10*3/uL (ref 0.7–3.1)
Lymphs: 28 %
MCH: 30.4 pg (ref 26.6–33.0)
MCHC: 32.7 g/dL (ref 31.5–35.7)
MCV: 93 fL (ref 79–97)
Monocytes Absolute: 1 10*3/uL — ABNORMAL HIGH (ref 0.1–0.9)
Monocytes: 10 %
Neutrophils Absolute: 5.7 10*3/uL (ref 1.4–7.0)
Neutrophils: 58 %
Platelets: 353 10*3/uL (ref 150–450)
RBC: 4.41 x10E6/uL (ref 3.77–5.28)
RDW: 12 % (ref 11.7–15.4)
WBC: 9.8 10*3/uL (ref 3.4–10.8)

## 2024-01-18 LAB — LIPID PANEL
Cholesterol, Total: 126 mg/dL (ref 100–199)
HDL: 62 mg/dL (ref >39)
LDL CALC COMMENT:: 2 ratio (ref 0.0–4.4)
LDL Chol Calc (NIH): 50 mg/dL (ref 0–99)
Triglycerides: 71 mg/dL (ref 0–149)
VLDL Cholesterol Cal: 14 mg/dL (ref 5–40)

## 2024-01-21 ENCOUNTER — Other Ambulatory Visit (HOSPITAL_COMMUNITY): Payer: Self-pay

## 2024-01-21 ENCOUNTER — Ambulatory Visit: Payer: Medicare Other | Admitting: Family Medicine

## 2024-01-21 ENCOUNTER — Telehealth: Payer: Self-pay | Admitting: Family Medicine

## 2024-01-21 ENCOUNTER — Ambulatory Visit (INDEPENDENT_AMBULATORY_CARE_PROVIDER_SITE_OTHER): Payer: Medicare Other

## 2024-01-21 ENCOUNTER — Telehealth: Payer: Self-pay

## 2024-01-21 VITALS — BP 105/70 | HR 93 | Temp 97.9°F | Ht 61.0 in | Wt 144.0 lb

## 2024-01-21 DIAGNOSIS — F411 Generalized anxiety disorder: Secondary | ICD-10-CM

## 2024-01-21 DIAGNOSIS — F1320 Sedative, hypnotic or anxiolytic dependence, uncomplicated: Secondary | ICD-10-CM

## 2024-01-21 DIAGNOSIS — K2100 Gastro-esophageal reflux disease with esophagitis, without bleeding: Secondary | ICD-10-CM

## 2024-01-21 DIAGNOSIS — Z780 Asymptomatic menopausal state: Secondary | ICD-10-CM

## 2024-01-21 DIAGNOSIS — M150 Primary generalized (osteo)arthritis: Secondary | ICD-10-CM

## 2024-01-21 DIAGNOSIS — I10 Essential (primary) hypertension: Secondary | ICD-10-CM | POA: Diagnosis not present

## 2024-01-21 MED ORDER — ALENDRONATE SODIUM 70 MG PO TABS: ORAL_TABLET | ORAL | 3 refills | Status: DC

## 2024-01-21 MED ORDER — LOSARTAN POTASSIUM-HCTZ 50-12.5 MG PO TABS
ORAL_TABLET | ORAL | 2 refills | Status: AC
Start: 2024-01-21 — End: ?

## 2024-01-21 MED ORDER — LORAZEPAM 0.5 MG PO TABS: 0.5000 mg | ORAL_TABLET | Freq: Every day | ORAL | 0 refills | Status: DC

## 2024-01-21 MED ORDER — OMEPRAZOLE-SODIUM BICARBONATE 40-1100 MG PO CAPS: ORAL_CAPSULE | ORAL | 0 refills | Status: DC

## 2024-01-21 NOTE — Progress Notes (Unsigned)
Subjective:  Patient ID: Lauren Stokes, female    DOB: [birth year]  Age: 78 y.o. MRN: [id number]  CC: Medical Management of Chronic Issues and Cough (Started last Tuesday with a cough and mucous is coming up. Chest is sore. Feels hot but no fever. Yellow to clear mucous. Sinuses are burner. )   HPI Lauren Stokes presents for  follow-up of hypertension. Patient has no history of headache chest pain or shortness of breath or recent cough. Patient also denies symptoms of TIA such as focal numbness or weakness. Patient denies side effects from medication. States taking it regularly.  Patient presents with upper respiratory congestion. Rhinorrheais scant. Too congested There is sore throat. Patient reports coughing frequently as well.  Clear to yellow sputum noted. There is no fever,  having sweats. The patient denies being short of breath. Onset was 6 days ago. Gradually worsening. Tried OTCs without improvement.  Patient in for follow-up of GERD. Currently asymptomatic taking  PPI daily. There is no chest pain or heartburn. No hematemesis and no melena. No dysphagia or choking. Onset is remote. Progression is stable. Complicating factors, none.    History Lauren Stokes has a past medical history of Allergy, Collagen vascular disease (HCC), Depression, Essential tremor, GERD (gastroesophageal reflux disease), and Hypertension.   She has a past surgical history that includes Cosmetic surgery and Cholecystectomy.   Her family history includes Aneurysm in her mother; Anxiety disorder in her mother; Cancer in her father; Hearing loss in her sister; Pneumonia in her mother; Ulcers in her mother.She reports that she has never smoked. She has never used smokeless tobacco. She reports that she does not drink alcohol and does not use drugs.  Current Outpatient Medications on File Prior to Visit  Medication Sig Dispense Refill   acetaminophen (TYLENOL) 650 MG CR tablet Take 650 mg by mouth every 8 (eight)  hours as needed for pain.     Boswellia-Glucosamine-Vit D (OSTEO BI-FLEX ONE PER DAY PO) Take by mouth.     calcium citrate-vitamin D (CITRACAL+D) 315-200 MG-UNIT tablet Take 1 tablet by mouth daily.     cholecalciferol (VITAMIN D3) 25 MCG (1000 UNIT) tablet Take 1 tablet (1,000 Units total) by mouth daily. 90 tablet 2   clotrimazole-betamethasone (LOTRISONE) cream Apply 1 application topically 2 (two) times daily. To affected areas until rash clears 45 g 1   Cyanocobalamin (VITAMIN B 12 PO) Take 1 tablet by mouth daily.     diclofenac (VOLTAREN) 75 MG EC tablet Take 1 tablet (75 mg total) by mouth 2 (two) times daily. 180 tablet 3   DULoxetine (CYMBALTA) 60 MG capsule Take 1 capsule (60 mg total) by mouth daily. 90 capsule 3   fexofenadine-pseudoephedrine (ALLEGRA-D 24) 180-240 MG 24 hr tablet Take 1 tablet by mouth every evening. For allergy and congestion 30 tablet 11   fluticasone (FLONASE) 50 MCG/ACT nasal spray USE 1 TO 2 SPRAYS IN EACH NOSTRIL DAILY 16 g 2   montelukast (SINGULAIR) 10 MG tablet TAKE ONE (1) TABLET BY MOUTH EVERY DAY 90 tablet 1   No current facility-administered medications on file prior to visit.    ROS Review of Systems  Objective:  BP 105/70   Pulse 93   Temp 97.9 F (36.6 C)   Ht 5\' 1"  (1.549 m)   Wt 144 lb (65.3 kg)   SpO2 96%   BMI 27.21 kg/m   BP Readings from Last 3 Encounters:  01/21/24 105/70  07/02/23 122/71  01/10/23 118/68  Wt Readings from Last 3 Encounters:  01/21/24 144 lb (65.3 kg)  07/02/23 149 lb (67.6 kg)  02/11/23 150 lb (68 kg)     Physical Exam    Assessment & Plan:   Lauren Stokes was seen today for medical management of chronic issues and cough.  Diagnoses and all orders for this visit:  Primary osteoarthritis involving multiple joints -     Cane adjustable wide base quad  GAD (generalized anxiety disorder) -     LORazepam (ATIVAN) 0.5 MG tablet; Take 1 tablet (0.5 mg total) by mouth at bedtime.  Benzodiazepine  dependence (HCC) -     LORazepam (ATIVAN) 0.5 MG tablet; Take 1 tablet (0.5 mg total) by mouth at bedtime.  Essential hypertension -     losartan-hydrochlorothiazide (HYZAAR) 50-12.5 MG tablet; TAKE ONE (1) TABLET BY MOUTH EVERY DAY  Gastroesophageal reflux disease with esophagitis, unspecified whether hemorrhage -     omeprazole-sodium bicarbonate (ZEGERID) 40-1100 MG capsule; TAKE 1 CAPSULE BY MOUTH EVERY MORNING BEFORE BREAKFAST  Other orders -     alendronate (FOSAMAX) 70 MG tablet; TAKE 1 TABLET WEEKLY (TAKE WITH 8OZ OF WATER 30 MINUTES BEFORE BREAKFAST)   Allergies as of 01/21/2024       Reactions   Cefaclor Nausea And Vomiting   Penicillins Rash   Tetracyclines & Related Rash   vomiting        Medication List        Accurate as of January 21, 2024  2:27 PM. If you have any questions, ask your nurse or doctor.          acetaminophen 650 MG CR tablet Commonly known as: TYLENOL Take 650 mg by mouth every 8 (eight) hours as needed for pain.   alendronate 70 MG tablet Commonly known as: FOSAMAX TAKE 1 TABLET WEEKLY (TAKE WITH 8OZ OF WATER 30 MINUTES BEFORE BREAKFAST)   calcium citrate-vitamin D 315-200 MG-UNIT tablet Commonly known as: CITRACAL+D Take 1 tablet by mouth daily.   cholecalciferol 25 MCG (1000 UNIT) tablet Commonly known as: VITAMIN D3 Take 1 tablet (1,000 Units total) by mouth daily.   clotrimazole-betamethasone cream Commonly known as: Lotrisone Apply 1 application topically 2 (two) times daily. To affected areas until rash clears   diclofenac 75 MG EC tablet Commonly known as: VOLTAREN Take 1 tablet (75 mg total) by mouth 2 (two) times daily.   DULoxetine 60 MG capsule Commonly known as: Cymbalta Take 1 capsule (60 mg total) by mouth daily.   fexofenadine-pseudoephedrine 180-240 MG 24 hr tablet Commonly known as: ALLEGRA-D 24 Take 1 tablet by mouth every evening. For allergy and congestion   fluticasone 50 MCG/ACT nasal  spray Commonly known as: FLONASE USE 1 TO 2 SPRAYS IN EACH NOSTRIL DAILY   LORazepam 0.5 MG tablet Commonly known as: ATIVAN Take 1 tablet (0.5 mg total) by mouth at bedtime.   losartan-hydrochlorothiazide 50-12.5 MG tablet Commonly known as: HYZAAR TAKE ONE (1) TABLET BY MOUTH EVERY DAY   montelukast 10 MG tablet Commonly known as: SINGULAIR TAKE ONE (1) TABLET BY MOUTH EVERY DAY   omeprazole-sodium bicarbonate 40-1100 MG capsule Commonly known as: ZEGERID TAKE 1 CAPSULE BY MOUTH EVERY MORNING BEFORE BREAKFAST   OSTEO BI-FLEX ONE PER DAY PO Take by mouth.   VITAMIN B 12 PO Take 1 tablet by mouth daily.        Meds ordered this encounter  Medications   alendronate (FOSAMAX) 70 MG tablet    Sig: TAKE 1 TABLET WEEKLY (TAKE WITH 8OZ  OF WATER 30 MINUTES BEFORE BREAKFAST)    Dispense:  13 tablet    Refill:  3   LORazepam (ATIVAN) 0.5 MG tablet    Sig: Take 1 tablet (0.5 mg total) by mouth at bedtime.    Dispense:  90 tablet    Refill:  0   losartan-hydrochlorothiazide (HYZAAR) 50-12.5 MG tablet    Sig: TAKE ONE (1) TABLET BY MOUTH EVERY DAY    Dispense:  90 tablet    Refill:  2   omeprazole-sodium bicarbonate (ZEGERID) 40-1100 MG capsule    Sig: TAKE 1 CAPSULE BY MOUTH EVERY MORNING BEFORE BREAKFAST    Dispense:  90 capsule    Refill:  0    ***  Follow-up: No follow-ups on file.  Mechele Claude, M.D.

## 2024-01-21 NOTE — Telephone Encounter (Signed)
PA request has been Submitted. New Encounter created for follow up. For additional info see Pharmacy Prior Auth telephone encounter from 01/21/24.

## 2024-01-21 NOTE — Telephone Encounter (Signed)
Copied from CRM [id number]. Topic: Clinical - Prescription Issue >> Jan 21, 2024  3:41 PM Martha Clan wrote: Reason for CRM: omeprazole-sodium bicarbonate (ZEGERID) 40-1100 MG capsule Pharmacy does not have prescription and is waiting on insurance to come from clinic.

## 2024-01-21 NOTE — Telephone Encounter (Signed)
[facility] to [facility]  [facility] needs a PA Can the pt get a script for the Omeprazole 40 mg, then get the sodium bicarbonate OTC Please advise

## 2024-01-21 NOTE — Telephone Encounter (Signed)
Pharmacy Patient Advocate Encounter   Received notification from Pt Calls Messages that prior authorization for Omeprazole-Sodium Bicarbonate 40-1100MG  capsules is required/requested.   Insurance verification completed.   The patient is insured through [facility] .   Per test claim: PA required; PA submitted to above mentioned insurance via CoverMyMeds Key/confirmation #/EOC  [facility] Status is pending

## 2024-01-22 ENCOUNTER — Telehealth: Payer: Self-pay

## 2024-01-22 ENCOUNTER — Other Ambulatory Visit: Payer: Self-pay | Admitting: Family Medicine

## 2024-01-22 LAB — DRUG SCREEN 10 W/CONF, SERUM
Amphetamines, IA: NEGATIVE ng/mL
Barbiturates, IA: NEGATIVE ug/mL
Benzodiazepines, IA: NEGATIVE ng/mL
Cocaine & Metabolite, IA: NEGATIVE ng/mL
Methadone, IA: NEGATIVE ng/mL
Opiates, IA: NEGATIVE ng/mL
Oxycodones, IA: NEGATIVE ng/mL
Phencyclidine, IA: NEGATIVE ng/mL
Propoxyphene, IA: NEGATIVE ng/mL
THC(Marijuana) Metabolite, IA: NEGATIVE ng/mL

## 2024-01-22 MED ORDER — SODIUM BICARBONATE 650 MG PO TABS: 650.0000 mg | ORAL_TABLET | Freq: Two times a day (BID) | ORAL | 5 refills | Status: DC

## 2024-01-22 MED ORDER — OMEPRAZOLE 40 MG PO CPDR
40.0000 mg | DELAYED_RELEASE_CAPSULE | Freq: Every day | ORAL | 3 refills | Status: DC
Start: 2024-01-22 — End: 2024-05-28

## 2024-01-22 NOTE — Telephone Encounter (Signed)
Please let the patient know that I sent their prescription to their pharmacy. Thanks, WS

## 2024-01-22 NOTE — Telephone Encounter (Signed)
Copied from CRM [id number]. Topic: Clinical - Medication Question >> Jan 22, 2024 12:47 PM Heather B wrote: Reason for CRM: omeprazole-sodium bicarbonate omeprazole-sodium bicarbonate (ZEGERID) 40-1100 MG capsule Patient states pharmacy([facility]) advised using OTC Omeprazole, patient confused on what medication to take for reflux. [phone]

## 2024-01-22 NOTE — Telephone Encounter (Signed)
Patient informed. LS

## 2024-01-23 ENCOUNTER — Other Ambulatory Visit (HOSPITAL_COMMUNITY): Payer: Self-pay

## 2024-01-23 DIAGNOSIS — M8589 Other specified disorders of bone density and structure, multiple sites: Secondary | ICD-10-CM | POA: Diagnosis not present

## 2024-01-23 DIAGNOSIS — Z780 Asymptomatic menopausal state: Secondary | ICD-10-CM | POA: Diagnosis not present

## 2024-01-23 NOTE — Telephone Encounter (Signed)
Pharmacy Patient Advocate Encounter  Received notification from [facility] that Prior Authorization for Omeprazole-Sodium Bicarbonate 40-1100MG  capsules has been APPROVED from 01/22/24 to 01/20/25. Ran test claim, Copay is $4.90. This test claim was processed through [facility]- copay amounts may vary at other pharmacies due to pharmacy/plan contracts, or as the patient moves through the different stages of their insurance plan.   PA #/Case ID/Reference #: [id number]

## 2024-01-24 ENCOUNTER — Encounter: Payer: Self-pay | Admitting: Family Medicine

## 2024-01-24 NOTE — Progress Notes (Signed)
DEXA shows osteopenia.Continue weekly fosamax. Nurse, if needed, send in Fosamax 70 mg weekly, #13.  Thanks, WS

## 2024-02-12 ENCOUNTER — Ambulatory Visit: Payer: Medicare Other

## 2024-02-12 VITALS — Ht 61.0 in | Wt 144.0 lb

## 2024-02-12 DIAGNOSIS — Z0000 Encounter for general adult medical examination without abnormal findings: Secondary | ICD-10-CM

## 2024-02-12 NOTE — Progress Notes (Signed)
 Subjective:   Lauren Stokes is a 78 y.o. who presents for a Medicare Wellness preventive visit.  Visit Complete: Virtual I connected with  Lauren Stokes on 02/12/24 by a audio enabled telemedicine application and verified that I am speaking with the correct person using two identifiers.  Patient Location: Home  Provider Location: Home Office  I discussed the limitations of evaluation and management by telemedicine. The patient expressed understanding and agreed to proceed.  Vital Signs: Because this visit was a virtual/telehealth visit, some criteria may be missing or patient reported. Any vitals not documented were not able to be obtained and vitals that have been documented are patient reported.  VideoDeclined- This patient declined Librarian, academic. Therefore the visit was completed with audio only.  AWV Questionnaire: No: Patient Medicare AWV questionnaire was not completed prior to this visit.  Cardiac Risk Factors include: advanced age (>41men, >63 women);dyslipidemia     Objective:    Today's Vitals   02/12/24 1035  Weight: 144 lb (65.3 kg)  Height: 5\' 1"  (1.549 m)   Body mass index is 27.21 kg/m.     02/12/2024   10:39 AM 02/11/2023   10:36 AM 02/07/2022   11:24 AM  Advanced Directives  Does Patient Have a Medical Advance Directive? No No No  Would patient like information on creating a medical advance directive? Yes (MAU/Ambulatory/Procedural Areas - Information given) No - Patient declined No - Patient declined    Current Medications (verified) Outpatient Encounter Medications as of 02/12/2024  Medication Sig   acetaminophen (TYLENOL) 650 MG CR tablet Take 650 mg by mouth every 8 (eight) hours as needed for pain.   alendronate (FOSAMAX) 70 MG tablet TAKE 1 TABLET WEEKLY (TAKE WITH 8OZ OF WATER 30 MINUTES BEFORE BREAKFAST)   Boswellia-Glucosamine-Vit D (OSTEO BI-FLEX ONE PER DAY PO) Take by mouth.   calcium citrate-vitamin D  (CITRACAL+D) 315-200 MG-UNIT tablet Take 1 tablet by mouth daily.   cholecalciferol (VITAMIN D3) 25 MCG (1000 UNIT) tablet Take 1 tablet (1,000 Units total) by mouth daily.   clotrimazole-betamethasone (LOTRISONE) cream Apply 1 application topically 2 (two) times daily. To affected areas until rash clears   Cyanocobalamin (VITAMIN B 12 PO) Take 1 tablet by mouth daily.   diclofenac (VOLTAREN) 75 MG EC tablet Take 1 tablet (75 mg total) by mouth 2 (two) times daily.   DULoxetine (CYMBALTA) 60 MG capsule Take 1 capsule (60 mg total) by mouth daily.   fexofenadine-pseudoephedrine (ALLEGRA-D 24) 180-240 MG 24 hr tablet Take 1 tablet by mouth every evening. For allergy and congestion   fluticasone (FLONASE) 50 MCG/ACT nasal spray USE 1 TO 2 SPRAYS IN EACH NOSTRIL DAILY   LORazepam (ATIVAN) 0.5 MG tablet Take 1 tablet (0.5 mg total) by mouth at bedtime.   losartan-hydrochlorothiazide (HYZAAR) 50-12.5 MG tablet TAKE ONE (1) TABLET BY MOUTH EVERY DAY   montelukast (SINGULAIR) 10 MG tablet TAKE ONE (1) TABLET BY MOUTH EVERY DAY   omeprazole (PRILOSEC) 40 MG capsule Take 1 capsule (40 mg total) by mouth daily.   sodium bicarbonate 650 MG tablet Take 1 tablet (650 mg total) by mouth 2 (two) times daily.   No facility-administered encounter medications on file as of 02/12/2024.    Allergies (verified) Cefaclor, Penicillins, and Tetracyclines & related   History: Past Medical History:  Diagnosis Date   Allergy    Collagen vascular disease (HCC)    Depression    Essential tremor    GERD (gastroesophageal reflux disease)  Hypertension    Past Surgical History:  Procedure Laterality Date   CHOLECYSTECTOMY     COSMETIC SURGERY     facial  (born deformed)    Family History  Problem Relation Age of Onset   Anxiety disorder Mother    Aneurysm Mother    Pneumonia Mother    Ulcers Mother    Cancer Father        brain tumor   Hearing loss Sister    Social History   Socioeconomic History    Marital status: Single    Spouse name: Not on file   Number of children: 0   Years of education: Not on file   Highest education level: Not on file  Occupational History   Occupation: retired   Tobacco Use   Smoking status: Never   Smokeless tobacco: Never  Vaping Use   Vaping status: Never Used  Substance and Sexual Activity   Alcohol use: Never   Drug use: Never   Sexual activity: Not Currently  Other Topics Concern   Not on file  Social History Narrative   Lives with sister and brother-n-law    Social Drivers of Health   Financial Resource Strain: Low Risk  (02/12/2024)   Overall Financial Resource Strain (CARDIA)    Difficulty of Paying Living Expenses: Not hard at all  Food Insecurity: No Food Insecurity (02/12/2024)   Hunger Vital Sign    Worried About Running Out of Food in the Last Year: Never true    Ran Out of Food in the Last Year: Never true  Transportation Needs: No Transportation Needs (02/12/2024)   PRAPARE - Administrator, Civil Service (Medical): No    Lack of Transportation (Non-Medical): No  Physical Activity: Insufficiently Active (02/12/2024)   Exercise Vital Sign    Days of Exercise per Week: 3 days    Minutes of Exercise per Session: 30 min  Stress: No Stress Concern Present (02/12/2024)   [employer] of Occupational Health - Occupational Stress Questionnaire    Feeling of Stress : Not at all  Social Connections: Moderately Integrated (02/12/2024)   Social Connection and Isolation Panel [NHANES]    Frequency of Communication with Friends and Family: More than three times a week    Frequency of Social Gatherings with Friends and Family: Three times a week    Attends Religious Services: More than 4 times per year    Active Member of Clubs or Organizations: Yes    Attends Engineer, structural: More than 4 times per year    Marital Status: Never married    Tobacco Counseling Counseling given: Not Answered    Clinical  Intake:  Pre-visit preparation completed: Yes  Pain : No/denies pain     Diabetes: No  How often do you need to have someone help you when you read instructions, pamphlets, or other written materials from your doctor or pharmacy?: 1 - Never  Interpreter Needed?: No  Information entered by :: Kandis Fantasia LPN   Activities of Daily Living     02/12/2024   10:39 AM  In your present state of health, do you have any difficulty performing the following activities:  Hearing? 0  Vision? 0  Difficulty concentrating or making decisions? 0  Walking or climbing stairs? 0  Dressing or bathing? 0  Doing errands, shopping? 0  Preparing Food and eating ? N  Using the Toilet? N  In the past six months, have you accidently leaked urine? N  Do you have problems with loss of bowel control? N  Managing your Medications? N  Managing your Finances? N  Housekeeping or managing your Housekeeping? N    Patient Care Team: Mechele Claude, MD as PCP - General (Family Medicine)  Indicate any recent Medical Services you may have received from other than [facility] providers in the past year (date may be approximate).     Assessment:   This is a routine wellness examination for Lauren Stokes.  Hearing/Vision screen Hearing Screening - Comments:: Denies hearing difficulties   Vision Screening - Comments:: Wears rx glasses - up to date with routine eye exams with Dr. Conley Rolls      Goals Addressed             This Visit's Progress    Remain active and independent         Depression Screen     02/12/2024   10:37 AM 07/02/2023    3:04 PM 02/11/2023   10:35 AM 12/31/2022    3:07 PM 09/26/2022    1:38 PM 05/16/2022    1:27 PM 02/07/2022   11:21 AM  PHQ 2/9 Scores  PHQ - 2 Score 0 0 0 0 0 0 0    Fall Risk     02/12/2024   10:38 AM 07/02/2023    3:04 PM 02/11/2023   10:34 AM 12/31/2022    3:07 PM 09/26/2022    1:38 PM  Fall Risk   Falls in the past year? 0 0 0 0 0  Number falls in past yr: 0  0    Injury  with Fall? 0  0    Risk for fall due to : No Fall Risks  No Fall Risks    Follow up Falls prevention discussed;Education provided;Falls evaluation completed  Falls prevention discussed      MEDICARE RISK AT HOME:  Medicare Risk at Home Any stairs in or around the home?: No If so, are there any without handrails?: No Home free of loose throw rugs in walkways, pet beds, electrical cords, etc?: Yes Adequate lighting in your home to reduce risk of falls?: Yes Life alert?: No Use of a cane, walker or w/c?: No Grab bars in the bathroom?: Yes Shower chair or bench in shower?: No Elevated toilet seat or a handicapped toilet?: Yes  TIMED UP AND GO:  Was the test performed?  No  Cognitive Function: 6CIT completed        02/12/2024   10:39 AM 02/11/2023   10:36 AM 02/07/2022   11:28 AM  6CIT Screen  What Year? 0 points 0 points 0 points  What month? 0 points 0 points 0 points  What time? 0 points 0 points 0 points  Count back from 20 0 points 0 points 0 points  Months in reverse 0 points 0 points 0 points  Repeat phrase 0 points 0 points 0 points  Total Score 0 points 0 points 0 points    Immunizations Immunization History  Administered Date(s) Administered   Fluad Quad(high Dose 65+) 09/07/2019, 09/16/2020, 09/25/2021, 09/26/2022   Fluad Trivalent(High Dose 65+) 09/04/2023   Influenza, High Dose Seasonal PF 10/04/2015, 09/12/2016, 09/26/2017, 09/23/2018   Influenza,trivalent, recombinat, inj, PF 09/20/2014   Moderna SARS-COV2 Booster Vaccination 11/23/2020   Moderna Sars-Covid-2 Vaccination 02/15/2020, 03/14/2020   Pneumococcal Conjugate-13 11/24/2015   Pneumococcal Polysaccharide-23 12/13/2016   Tdap 07/25/2015   Zoster Recombinant(Shingrix) 06/30/2018, 10/07/2018    Screening Tests Health Maintenance  Topic Date Due   COVID-19 Vaccine (3 -  Moderna risk series) 12/21/2020   Medicare Annual Wellness (AWV)  02/11/2024   DTaP/Tdap/Td (2 - Td or Tdap) 07/24/2025   DEXA SCAN   01/22/2026   Pneumonia Vaccine 45+ Years old  Completed   INFLUENZA VACCINE  Completed   Hepatitis C Screening  Completed   Zoster Vaccines- Shingrix  Completed   HPV VACCINES  Aged Out    Health Maintenance  Health Maintenance Due  Topic Date Due   COVID-19 Vaccine (3 - Moderna risk series) 12/21/2020   Medicare Annual Wellness (AWV)  02/11/2024    Additional Screening:  Vision Screening: Recommended annual ophthalmology exams for early detection of glaucoma and other disorders of the eye.  Dental Screening: Recommended annual dental exams for proper oral hygiene  Community Resource Referral / Chronic Care Management: CRR required this visit?  No   CCM required this visit?  No     Plan:     I have personally reviewed and noted the following in the patient's chart:   Medical and social history Use of alcohol, tobacco or illicit drugs  Current medications and supplements including opioid prescriptions. Patient is not currently taking opioid prescriptions. Functional ability and status Nutritional status Physical activity Advanced directives List of other physicians Hospitalizations, surgeries, and ER visits in previous 12 months Vitals Screenings to include cognitive, depression, and falls Referrals and appointments  In addition, I have reviewed and discussed with patient certain preventive protocols, quality metrics, and best practice recommendations. A written personalized care plan for preventive services as well as general preventive health recommendations were provided to patient.     Kandis Fantasia [place], [place]   08/16/2951   After Visit Summary: (Mail) Due to this being a telephonic visit, the after visit summary with patients personalized plan was offered to patient via mail   Notes: Nothing significant to report at this time.

## 2024-02-12 NOTE — Patient Instructions (Signed)
 Lauren Stokes , Thank you for taking time to come for your Medicare Wellness Visit. I appreciate your ongoing commitment to your health goals. Please review the following plan we discussed and let me know if I can assist you in the future.   Referrals/Orders/Follow-Ups/Clinician Recommendations: Aim for 30 minutes of exercise or brisk walking, 6-8 glasses of water, and 5 servings of fruits and vegetables each day.  This is a list of the screening recommended for you and due dates:  Health Maintenance  Topic Date Due   COVID-19 Vaccine (3 - Moderna risk series) 12/21/2020   Medicare Annual Wellness Visit  02/11/2025   DTaP/Tdap/Td vaccine (2 - Td or Tdap) 07/24/2025   DEXA scan (bone density measurement)  01/22/2026   Pneumonia Vaccine  Completed   Flu Shot  Completed   Hepatitis C Screening  Completed   Zoster (Shingles) Vaccine  Completed   HPV Vaccine  Aged Out    Advanced directives: (ACP Link)Information on Advanced Care Planning can be found at [facility] [place] Advance Health Care Directives Advance Health Care Directives ([email or web address]   Next Medicare Annual Wellness Visit scheduled for next year: Yes

## 2024-03-30 ENCOUNTER — Telehealth: Payer: Self-pay | Admitting: Family Medicine

## 2024-03-30 DIAGNOSIS — Z0279 Encounter for issue of other medical certificate: Secondary | ICD-10-CM

## 2024-03-30 NOTE — Telephone Encounter (Signed)
 Lauren Stokes dropped off handicap forms to be completed and signed.  Form Fee Paid? (Yes)            If NO, form is placed on front office manager desk to hold until payment received. If YES, then form will be placed in the RX/HH Nurse Coordinators box for completion.  Form will not be processed until payment is received

## 2024-04-06 ENCOUNTER — Other Ambulatory Visit: Payer: Self-pay

## 2024-04-06 ENCOUNTER — Emergency Department (HOSPITAL_COMMUNITY)

## 2024-04-06 ENCOUNTER — Emergency Department (HOSPITAL_COMMUNITY)
Admission: EM | Admit: 2024-04-06 | Discharge: 2024-04-07 | Disposition: A | Discharge status: Home / Self Care | Attending: Emergency Medicine | Admitting: Emergency Medicine

## 2024-04-06 ENCOUNTER — Encounter (HOSPITAL_COMMUNITY): Payer: Self-pay

## 2024-04-06 DIAGNOSIS — R58 Hemorrhage, not elsewhere classified: Secondary | ICD-10-CM | POA: Diagnosis not present

## 2024-04-06 DIAGNOSIS — S066XAA Traumatic subarachnoid hemorrhage with loss of consciousness status unknown, initial encounter: Secondary | ICD-10-CM | POA: Insufficient documentation

## 2024-04-06 DIAGNOSIS — M47816 Spondylosis without myelopathy or radiculopathy, lumbar region: Secondary | ICD-10-CM | POA: Diagnosis not present

## 2024-04-06 DIAGNOSIS — I609 Nontraumatic subarachnoid hemorrhage, unspecified: Secondary | ICD-10-CM | POA: Diagnosis not present

## 2024-04-06 DIAGNOSIS — R2689 Other abnormalities of gait and mobility: Secondary | ICD-10-CM | POA: Insufficient documentation

## 2024-04-06 DIAGNOSIS — N83201 Unspecified ovarian cyst, right side: Secondary | ICD-10-CM | POA: Diagnosis not present

## 2024-04-06 DIAGNOSIS — W19XXXA Unspecified fall, initial encounter: Secondary | ICD-10-CM | POA: Diagnosis not present

## 2024-04-06 DIAGNOSIS — G319 Degenerative disease of nervous system, unspecified: Secondary | ICD-10-CM | POA: Diagnosis not present

## 2024-04-06 DIAGNOSIS — I10 Essential (primary) hypertension: Secondary | ICD-10-CM | POA: Insufficient documentation

## 2024-04-06 DIAGNOSIS — Y92481 Parking lot as the place of occurrence of the external cause: Secondary | ICD-10-CM | POA: Insufficient documentation

## 2024-04-06 DIAGNOSIS — D72829 Elevated white blood cell count, unspecified: Secondary | ICD-10-CM | POA: Insufficient documentation

## 2024-04-06 DIAGNOSIS — S0101XA Laceration without foreign body of scalp, initial encounter: Secondary | ICD-10-CM | POA: Diagnosis not present

## 2024-04-06 DIAGNOSIS — S0003XA Contusion of scalp, initial encounter: Secondary | ICD-10-CM | POA: Diagnosis not present

## 2024-04-06 DIAGNOSIS — S3992XA Unspecified injury of lower back, initial encounter: Secondary | ICD-10-CM | POA: Diagnosis not present

## 2024-04-06 DIAGNOSIS — R2681 Unsteadiness on feet: Secondary | ICD-10-CM | POA: Insufficient documentation

## 2024-04-06 DIAGNOSIS — W010XXA Fall on same level from slipping, tripping and stumbling without subsequent striking against object, initial encounter: Secondary | ICD-10-CM | POA: Insufficient documentation

## 2024-04-06 DIAGNOSIS — M6281 Muscle weakness (generalized): Secondary | ICD-10-CM | POA: Insufficient documentation

## 2024-04-06 DIAGNOSIS — M545 Low back pain, unspecified: Secondary | ICD-10-CM | POA: Diagnosis not present

## 2024-04-06 DIAGNOSIS — S0990XA Unspecified injury of head, initial encounter: Secondary | ICD-10-CM | POA: Diagnosis not present

## 2024-04-06 DIAGNOSIS — Z79899 Other long term (current) drug therapy: Secondary | ICD-10-CM | POA: Insufficient documentation

## 2024-04-06 DIAGNOSIS — S066X0A Traumatic subarachnoid hemorrhage without loss of consciousness, initial encounter: Secondary | ICD-10-CM | POA: Diagnosis not present

## 2024-04-06 DIAGNOSIS — S199XXA Unspecified injury of neck, initial encounter: Secondary | ICD-10-CM | POA: Diagnosis not present

## 2024-04-06 LAB — CBC WITH DIFFERENTIAL/PLATELET
Abs Immature Granulocytes: 0.05 10*3/uL (ref 0.00–0.07)
Basophils Absolute: 0.1 10*3/uL (ref 0.0–0.1)
Basophils Relative: 0 %
Eosinophils Absolute: 0.2 10*3/uL (ref 0.0–0.5)
Eosinophils Relative: 2 %
HCT: 36.5 % (ref 36.0–46.0)
Hemoglobin: 12 g/dL (ref 12.0–15.0)
Immature Granulocytes: 0 %
Lymphocytes Relative: 17 %
Lymphs Abs: 1.9 10*3/uL (ref 0.7–4.0)
MCH: 30.9 pg (ref 26.0–34.0)
MCHC: 32.9 g/dL (ref 30.0–36.0)
MCV: 94.1 fL (ref 80.0–100.0)
Monocytes Absolute: 1 10*3/uL (ref 0.1–1.0)
Monocytes Relative: 9 %
Neutro Abs: 8.3 10*3/uL — ABNORMAL HIGH (ref 1.7–7.7)
Neutrophils Relative %: 72 %
Platelets: 323 10*3/uL (ref 150–400)
RBC: 3.88 MIL/uL (ref 3.87–5.11)
RDW: 13.1 % (ref 11.5–15.5)
WBC: 11.6 10*3/uL — ABNORMAL HIGH (ref 4.0–10.5)
nRBC: 0 % (ref 0.0–0.2)

## 2024-04-06 LAB — BASIC METABOLIC PANEL WITH GFR
Anion gap: 10 (ref 5–15)
BUN: 28 mg/dL — ABNORMAL HIGH (ref 8–23)
CO2: 25 mmol/L (ref 22–32)
Calcium: 9 mg/dL (ref 8.9–10.3)
Chloride: 102 mmol/L (ref 98–111)
Creatinine, Ser: 0.76 mg/dL (ref 0.44–1.00)
GFR, Estimated: >60 mL/min (ref >60)
Glucose, Bld: 164 mg/dL — ABNORMAL HIGH (ref 70–99)
Potassium: 3.5 mmol/L (ref 3.5–5.1)
Sodium: 137 mmol/L (ref 135–145)

## 2024-04-06 LAB — URINALYSIS, ROUTINE W REFLEX MICROSCOPIC
Bilirubin Urine: NEGATIVE
Glucose, UA: NEGATIVE mg/dL
Hgb urine dipstick: NEGATIVE
Ketones, ur: NEGATIVE mg/dL
Leukocytes,Ua: NEGATIVE
Nitrite: NEGATIVE
Protein, ur: NEGATIVE mg/dL
Specific Gravity, Urine: 1.016 (ref 1.005–1.030)
pH: 6 (ref 5.0–8.0)

## 2024-04-06 LAB — PROTIME-INR
INR: 1.1 (ref 0.8–1.2)
Prothrombin Time: 14.5 s (ref 11.4–15.2)

## 2024-04-06 LAB — APTT: aPTT: 29 s (ref 24–36)

## 2024-04-06 LAB — HEMOGLOBIN AND HEMATOCRIT, BLOOD
HCT: 34.2 % — ABNORMAL LOW (ref 36.0–46.0)
Hemoglobin: 11.2 g/dL — ABNORMAL LOW (ref 12.0–15.0)

## 2024-04-06 MED ORDER — LIDOCAINE-EPINEPHRINE (PF) 2 %-1:200000 IJ SOLN
INTRAMUSCULAR | Status: AC
  Filled 2024-04-06: qty 20

## 2024-04-06 MED ORDER — LIDOCAINE HCL (PF) 2 % IJ SOLN
INTRAMUSCULAR | Status: AC
  Filled 2024-04-06: qty 5

## 2024-04-06 NOTE — ED Provider Notes (Signed)
 [place] EMERGENCY DEPARTMENT AT [facility] Provider Note   CSN: [id number] Arrival date & time: 04/06/24  1518     History  Chief Complaint  Patient presents with   Lauren Stokes is a 78 y.o. female with PMH as listed below who presents via [facility] following a fall in the parking lot of a local bank. Pt reports she lost her balance stepping up on to the curb and she fell backwards. Pt does present with bleeding to posterior right scalp. [facility] has dressing in place in Triage and unable to assess severity of the laceration. Pt denies LOC. Pt denies taking blood thinners.   Past Medical History:  Diagnosis Date   Allergy    Collagen vascular disease (HCC)    Depression    Essential tremor    GERD (gastroesophageal reflux disease)    Hypertension        Home Medications Prior to Admission medications   Medication Sig Start Date End Date Taking? Authorizing Provider  acetaminophen  (TYLENOL ) 650 MG CR tablet Take 650 mg by mouth every 8 (eight) hours as needed for pain.    [provider]  alendronate  (FOSAMAX ) 70 MG tablet TAKE 1 TABLET WEEKLY (TAKE WITH 8OZ OF WATER 30 MINUTES BEFORE BREAKFAST) 01/21/24   Roise Cleaver, MD  Boswellia-Glucosamine-Vit D (OSTEO BI-FLEX ONE PER DAY PO) Take by mouth.    [provider]  calcium citrate-vitamin D  (CITRACAL+D) 315-200 MG-UNIT tablet Take 1 tablet by mouth daily.    [provider]  cholecalciferol (VITAMIN D3) 25 MCG (1000 UNIT) tablet Take 1 tablet (1,000 Units total) by mouth daily. 11/15/21   Roise Cleaver, MD  clotrimazole -betamethasone  (LOTRISONE ) cream Apply 1 application topically 2 (two) times daily. To affected areas until rash clears 11/15/20   Roise Cleaver, MD  Cyanocobalamin  (VITAMIN B 12 PO) Take 1 tablet by mouth daily.    [provider]  diclofenac  (VOLTAREN ) 75 MG EC tablet Take 1 tablet (75 mg total) by mouth 2 (two) times daily. 12/30/23   Roise Cleaver, MD   DULoxetine  (CYMBALTA ) 60 MG capsule Take 1 capsule (60 mg total) by mouth daily. 07/02/23   Roise Cleaver, MD  fexofenadine -pseudoephedrine (ALLEGRA-D 24) 180-240 MG 24 hr tablet Take 1 tablet by mouth every evening. For allergy and congestion 09/26/22   Roise Cleaver, MD  fluticasone  (FLONASE ) 50 MCG/ACT nasal spray USE 1 TO 2 SPRAYS IN EACH NOSTRIL DAILY 06/06/23   Roise Cleaver, MD  LORazepam  (ATIVAN ) 0.5 MG tablet Take 1 tablet (0.5 mg total) by mouth at bedtime. 01/21/24   Roise Cleaver, MD  losartan -hydrochlorothiazide (HYZAAR) 50-12.5 MG tablet TAKE ONE (1) TABLET BY MOUTH EVERY DAY 01/21/24   Roise Cleaver, MD  montelukast  (SINGULAIR ) 10 MG tablet TAKE ONE (1) TABLET BY MOUTH EVERY DAY 11/05/23   Roise Cleaver, MD  omeprazole  (PRILOSEC) 40 MG capsule Take 1 capsule (40 mg total) by mouth daily. 01/22/24   Roise Cleaver, MD  sodium bicarbonate  650 MG tablet Take 1 tablet (650 mg total) by mouth 2 (two) times daily. 01/22/24   Roise Cleaver, MD      Allergies    Cefaclor, Penicillins, and Tetracyclines & related    Review of Systems   Review of Systems A 10 point review of systems was performed and is negative unless otherwise reported in HPI.  Physical Exam Updated Vital Signs BP (!) 133/47   Pulse (!) 104   Temp 98.4 F (36.9 C)   Resp  18   Ht 5\' 1"  (1.549 m)   Wt 65.3 kg   SpO2 100%   BMI 27.20 kg/m  Physical Exam General: Normal appearing elderly female, sitting in bed.  HEENT: R occipital posterior scalp laceration with pulsatile bleeding/acute hemorrhage. No skull depressions/deformities. PERRLA, EOMI, Sclera anicteric, MMM, trachea midline.  Cardiology: RRR, no murmurs/rubs/gallops.  Resp: Normal respiratory rate and effort. CTAB, no wheezes, rhonchi, crackles.  Abd: Soft, non-tender, non-distended. No rebound tenderness or guarding.  Pelvis: Pelvis stable/non-tender. MSK: No peripheral edema or signs of trauma. Extremities without deformity or TTP. No cyanosis or  clubbing. Skin: warm, dry. No rashes or lesions. Back: No CVA tenderness, no midline C T or L spine TTP, deformities or stepoffs.  Neuro: A&Ox4, CNs II-XII grossly intact. MAEs. Sensation grossly intact.  Psych: Normal mood and affect.   ED Results / Procedures / Treatments   Labs (all labs ordered are listed, but only abnormal results are displayed) Labs Reviewed  CBC WITH DIFFERENTIAL/PLATELET - Abnormal; Notable for the following components:      Result Value   WBC 11.6 (*)    Neutro Abs 8.3 (*)    All other components within normal limits  BASIC METABOLIC PANEL WITH GFR - Abnormal; Notable for the following components:   Glucose, Bld 164 (*)    BUN 28 (*)    All other components within normal limits  URINALYSIS, ROUTINE W REFLEX MICROSCOPIC - Abnormal; Notable for the following components:   APPearance HAZY (*)    All other components within normal limits  HEMOGLOBIN AND HEMATOCRIT, BLOOD - Abnormal; Notable for the following components:   Hemoglobin 11.2 (*)    HCT 34.2 (*)    All other components within normal limits  PROTIME-INR  APTT    EKG None  Radiology CTH, CT C-spine: 1. Small left frontal subarachnoid hemorrhage. No mass effect or midline shift. 2. No acute/traumatic cervical spine pathology.  CTH 5h repeat; 1. Trace left frontal subarachnoid hemorrhage has nearly resolved with only a tiny residual focus within a single sulcus now seen. No interval hemorrhage. No abnormal mass effect or midline shift.  CT L-spine: pending   Procedures .Critical Care  Performed by: Merdis Stalling, MD Authorized by: Merdis Stalling, MD   Critical care provider statement:    Critical care time (minutes):  30   Critical care was necessary to treat or prevent imminent or life-threatening deterioration of the following conditions: arterial bleeding, intracranial hemorrhage.   Critical care was time spent personally by me on the following activities:  Development of  treatment plan with patient or surrogate, discussions with consultants, evaluation of patient's response to treatment, examination of patient, ordering and review of laboratory studies, ordering and review of radiographic studies, ordering and performing treatments and interventions, pulse oximetry, re-evaluation of patient's condition and review of old charts .Laceration Repair  Date/Time: 04/10/2024 4:17 PM  Performed by: Merdis Stalling, MD Authorized by: Merdis Stalling, MD   Consent:    Consent obtained:  Emergent situation   Consent given by:  Patient Universal protocol:    Patient identity confirmed:  Verbally with patient Anesthesia:    Anesthesia method:  Local infiltration   Local anesthetic:  Lidocaine  1% WITH epi Laceration details:    Location:  Scalp   Scalp location:  Occipital   Length (cm):  2   Depth (mm):  5 Pre-procedure details:    Preparation:  Imaging obtained to evaluate for foreign bodies and patient was  prepped and draped in usual sterile fashion Exploration:    Hemostasis achieved with:  Epinephrine   Imaging obtained comment:  CT   Imaging outcome: foreign body not noted     Wound extent: vascular damage     Wound extent: no foreign body     Contaminated: no   Skin repair:    Repair method:  Sutures   Suture size:  3-0   Suture material:  Prolene   Suture technique:  Simple interrupted and figure eight   Number of sutures:  2 Approximation:    Approximation:  Close Repair type:    Repair type:  Intermediate Post-procedure details:    Dressing:  Bulky dressing and sterile dressing   Procedure completion:  Tolerated     Medications Ordered in ED Medications  lidocaine -EPINEPHrine (XYLOCAINE  W/EPI) 2 %-1:200000 (PF) injection (  Given by Other 04/06/24 1609)    ED Course/ Medical Decision Making/ A&P                          Medical Decision Making Amount and/or Complexity of Data Reviewed Labs: ordered. Decision-making details documented  in ED Course. Radiology: ordered. Decision-making details documented in ED Course.    This patient presents to the ED for concern of fall, scalp bleeding, back pain; this involves an extensive number of treatment options, and is a complaint that carries with it a high risk of complications and morbidity.  I considered the following differential and admission for this acute, potentially life threatening condition.   MDM:    Patient presents with fall and head trauma.  She initially was noted in triage to have arterial bleeding from her posterior scalp.  Patient was moved immediately to a room with pulsatile bleeding from the right occipital scalp noted.  Lido with epi and 1 figure-of-eight stitch, as well as an additional simple interrupted stitch were able to tamponade the bleeding. Placed bulky pressure dressing over and after several hours observation, no recurrence of her bleeding. She does have a drop of hemoglobin from 13.4 on 01/16/23, to 12.0 first check today to 11.2 on recheck. Instructed to have sutures removed by medical professional in 10 days and to watch for signs of infection. CTH does show small L frontal SAH, NSGY recommends 5h repeat which reassuringly shows resolution of the bleeding. She has no CT spine fx. On reevaluation, patient with lower back pain with +midline L spine TTP. Will be signed out pending CT L spine.    Clinical Course as of 04/10/24 1615  Mon Apr 06, 2024  1722 +L frontal SAH [HN]  1731 Hemoglobin: 12.0 Mildly decreased from 13.6 two months ago, BL appears 12.5-13.5 [HN]  1731 WBC(!): 11.6 Mild leukocytosis [HN]  1731 CT Head Wo Contrast 1. Small left frontal subarachnoid hemorrhage. No mass effect or midline shift. 2. No acute/traumatic cervical spine pathology.   [HN]  1736 Repeat CTH recommended by NSGY in 5 hours [HN]  2251 CT Head Wo Contrast 1. Trace left frontal subarachnoid hemorrhage has nearly resolved with only a tiny residual focus within a  single sulcus now seen. No interval hemorrhage. No abnormal mass effect or midline shift.   [HN]  2332 Hemoglobin(!): 11.2 Dropped by one point [HN]    Clinical Course User Index [HN] Merdis Stalling, MD    Labs: I Ordered, and personally interpreted labs.  The pertinent results include: Those listed above  Imaging Studies ordered: I ordered imaging studies including  CT head, repeat CT head, CT C and L-spine I independently visualized and interpreted imaging. I agree with the radiologist interpretation  Additional history obtained from chart review, niece at bedside.    Reevaluation: After the interventions noted above, I reevaluated the patient and found that they have :improved  Social Determinants of Health:  lives independently  Disposition:  Patient is signed out to the oncoming ED physician who is made aware of her history, presentation, exam, workup, and plan.    Co morbidities that complicate the patient evaluation  Past Medical History:  Diagnosis Date   Allergy    Collagen vascular disease (HCC)    Depression    Essential tremor    GERD (gastroesophageal reflux disease)    Hypertension      Medicines Meds ordered this encounter  Medications   DISCONTD: lidocaine  HCl (PF) (XYLOCAINE ) 2 % injection    Gantt, Emilee: cabinet override   lidocaine -EPINEPHrine (XYLOCAINE  W/EPI) 2 %-1:200000 (PF) injection    Gantt, Emilee: cabinet override   DISCONTD: acetaminophen  (TYLENOL ) tablet 1,000 mg    I have reviewed the patients home medicines and have made adjustments as needed  Problem List / ED Course: Problem List Items Addressed This Visit   None Visit Diagnoses       Cyst of right ovary    -  Primary     Laceration of scalp, initial encounter                       This note was created using dictation software, which may contain spelling or grammatical errors.    Merdis Stalling, MD 04/10/24 [phone]

## 2024-04-06 NOTE — ED Triage Notes (Signed)
 Pt arrived via [facility] following a fall in the parking lot of a local bank. Pt reports she lost her balance stepping up on to the curb and she fell backwards. Pt does present with bleeding to posterior right scalp. [facility] has dressing in place in Triage and unable to assess severity of the laceration. Pt denies LOC. Pt denies taking blood thinners.

## 2024-04-06 NOTE — ED Provider Triage Note (Signed)
 Emergency Medicine Provider Triage Evaluation Note  Lauren Stokes , a 78 y.o. female  was evaluated in triage.  Pt complains of head injury following fall in parking lot. Was attempting to go to bank when she lost her footing and fell backwards.  She denies thinners, LOC  Review of Systems  Positive: See HPI Negative: LOC  Physical Exam  BP (!) 155/126 (BP Location: Left Arm)   Pulse 94   Temp 98.1 F (36.7 C) (Oral)   Resp 18   Ht 5\' 1"  (1.549 m)   Wt 65.3 kg   SpO2 98%   BMI 27.20 kg/m  Gen:   Awake, no distress   Resp:  Normal effort  MSK:   Moves extremities without difficulty  Other:  Arterial pulsating bleed to posterior scalp. A&Ox3  Medical Decision Making  Medically screening exam initiated at 3:44 PM.  Appropriate orders placed.  CHARQUITA AHMANN was informed that the remainder of the evaluation will be completed by another provider, this initial triage assessment does not replace that evaluation, and the importance of remaining in the ED until their evaluation is complete.  Following unwrapping gauze, noticed a arterial bleed.  Immediately notified nursing staff and got patient in room for arterial repair.   Royann Cords, [place] 04/06/24 2038

## 2024-04-06 NOTE — ED Notes (Signed)
 Pt ambulated to restroom w/ no issues. Urine sample provided. Pt back to her stretcher with head of bed elevated per orders.

## 2024-04-07 ENCOUNTER — Emergency Department (HOSPITAL_COMMUNITY)

## 2024-04-07 DIAGNOSIS — S0101XA Laceration without foreign body of scalp, initial encounter: Secondary | ICD-10-CM | POA: Diagnosis not present

## 2024-04-07 DIAGNOSIS — M47816 Spondylosis without myelopathy or radiculopathy, lumbar region: Secondary | ICD-10-CM | POA: Diagnosis not present

## 2024-04-07 DIAGNOSIS — M545 Low back pain, unspecified: Secondary | ICD-10-CM | POA: Diagnosis not present

## 2024-04-07 DIAGNOSIS — S3992XA Unspecified injury of lower back, initial encounter: Secondary | ICD-10-CM | POA: Diagnosis not present

## 2024-04-07 MED ORDER — ACETAMINOPHEN 500 MG PO TABS: 1000.0000 mg | ORAL_TABLET | Freq: Once | ORAL | Status: DC

## 2024-04-07 NOTE — ED Notes (Signed)
 Family to be here to get pt within hour.

## 2024-04-07 NOTE — ED Provider Notes (Signed)
 Patient signed out to me by Dr. Drury Geralds to follow-up on CT.  Patient had a fall earlier and suffered a head laceration.  ET showed a small subarachnoid hemorrhage.  Patient monitored, repeat CT shows no evidence of bleeding.  Patient began to complain of back pain, was signed out with CT lumbar spine pending.  This has been performed and is negative.  Patient reports that she is uncomfortable going home at this time.  She think she might be able to go home in the morning.  Will allow her to stay in the ED tonight, obtain physical therapy evaluation in the morning to help identify her ambulation needs.  TOC consult placed.   Ballard Bongo, MD 04/07/24 [phone]

## 2024-04-07 NOTE — ED Provider Notes (Signed)
 Signout from Dr. Polina.  Patient boarding here after fall yesterday.  Had scalp plaque.  CT showing small subarachnoid.  Follow-up imaging showed no bleeding.  She is boarding as she was not comfortable going home last night.  PT evaluation this morning.  TOC eval. Physical Exam  BP 129/63   Pulse 76   Temp 98.1 F (36.7 C) (Oral)   Resp 16   Ht 5\' 1"  (1.549 m)   Wt 65.3 kg   SpO2 96%   BMI 27.20 kg/m   Physical Exam  Procedures  Procedures  ED Course / MDM   Clinical Course as of 04/07/24 0708  Mon Apr 06, 2024  1722 +L frontal SAH [HN]  1731 Hemoglobin: 12.0 Mildly decreased from 13.6 two months ago, BL appears 12.5-13.5 [HN]  1731 WBC(!): 11.6 Mild leukocytosis [HN]  1731 CT Head Wo Contrast 1. Small left frontal subarachnoid hemorrhage. No mass effect or midline shift. 2. No acute/traumatic cervical spine pathology.   [HN]  1736 Repeat CTH recommended by NSGY in 5 hours [HN]  2251 CT Head Wo Contrast 1. Trace left frontal subarachnoid hemorrhage has nearly resolved with only a tiny residual focus within a single sulcus now seen. No interval hemorrhage. No abnormal mass effect or midline shift.   [HN]  2332 Hemoglobin(!): 11.2 Dropped by one point [HN]    Clinical Course User Index [HN] Merdis Stalling, MD   Medical Decision Making Amount and/or Complexity of Data Reviewed Labs: ordered. Decision-making details documented in ED Course. Radiology: ordered. Decision-making details documented in ED Course.  Risk OTC drugs.   Patient was evaluated by PT and social work.  Social work is recommending home health PT OT and aide.  I have put an order for this.  Family is to be by in the next hour or so to pick patient up.       Tonya Fredrickson, MD 04/07/24 [phone]

## 2024-04-07 NOTE — ED Notes (Signed)
 Pt placed in [facility] bedside call bell to await family. Refused privacy screen.

## 2024-04-07 NOTE — Discharge Instructions (Addendum)
 When we did the CAT scan of your back, they saw that there is a cyst on your right ovary.  This needs to be looked at with ultrasound which we cannot do tonight.  Please contact your doctor for follow-up and further testing.

## 2024-04-07 NOTE — ED Notes (Signed)
 Patient transported to CT

## 2024-04-07 NOTE — Evaluation (Addendum)
 Physical Therapy Evaluation Patient Details Name: Lauren Stokes MRN: [id number] DOB: 30-Nov-1946 Today's Date: 04/07/2024  History of Present Illness  Patient signed out to me by Dr. Drury Geralds to follow-up on CT.  Patient had a fall earlier and suffered a head laceration.  ET showed a small subarachnoid hemorrhage.  Patient monitored, repeat CT shows no evidence of bleeding.  Patient began to complain of back pain, was signed out with CT lumbar spine pending.  This has been performed and is negative.     Patient reports that she is uncomfortable going home at this time.  She think she might be able to go home in the morning.  Will allow her to stay in the ED tonight, obtain physical therapy evaluation in the morning to help identify her ambulation needs.  TOC consult placed.   Clinical Impression  Patient near baseline other than requiring increased time for functional mobility, demonstrates good return for bed mobility, transfers and donning left ankle brace and shoes.  Patient able to ambulate in room, hallways without loss of balance or need for using an AD. Plan:  Patient discharged from physical therapy to care of nursing for ambulation daily as tolerated for length of stay.         If plan is discharge home, recommend the following: A little help with walking and/or transfers;Assistance with cooking/housework;Help with stairs or ramp for entrance   Can travel by private vehicle    Yes    Equipment Recommendations None recommended by PT  Recommendations for Other Services       Functional Status Assessment Patient has had a recent decline in their functional status and demonstrates the ability to make significant improvements in function in a reasonable and predictable amount of time.     Precautions / Restrictions Precautions Precautions: Fall Required Braces or Orthoses: Other Brace Other Brace: left ankle brace Restrictions Weight Bearing Restrictions Per Provider Order: No       Mobility  Bed Mobility Overal bed mobility: Modified Independent             General bed mobility comments: good return for sitting up at bedside with HOB flat    Transfers Overall transfer level: Modified independent Equipment used: None               General transfer comment: good return for transferring to/from chair    Ambulation/Gait Ambulation/Gait assistance: Modified independent (Device/Increase time), Supervision Gait Distance (Feet): 100 Feet Assistive device: None Gait Pattern/deviations: Decreased step length - right, Decreased step length - left, Decreased stride length Gait velocity: decreased     General Gait Details: slightly labored movement without loss of balance with good return for ambulating in rom/hallways wihtout need for an AD  Stairs            Wheelchair Mobility     Tilt Bed    Modified Rankin (Stroke Patients Only)       Balance Overall balance assessment: Mild deficits observed, not formally tested                                           Pertinent Vitals/Pain Pain Assessment Pain Assessment: Faces Faces Pain Scale: Hurts a little bit Pain Location: low back Pain Descriptors / Indicators: Discomfort, Sore Pain Intervention(s): Limited activity within patient's tolerance, Monitored during session, Repositioned    Home Living Family/patient expects to  be discharged to:: Private residence Living Arrangements: Other relatives Available Help at Discharge: Family;Available PRN/intermittently Type of Home: House Home Access: Level entry       Home Layout: One level Home Equipment: Agricultural consultant (2 wheels);Grab bars - tub/shower;BSC/3in1      Prior Function Prior Level of Function : Independent/Modified Independent;Driving             Mobility Comments: Community ambulation without AD, drives, shops ADLs Comments: Independent     Extremity/Trunk Assessment   Upper Extremity  Assessment Upper Extremity Assessment: Overall WFL for tasks assessed    Lower Extremity Assessment Lower Extremity Assessment: Generalized weakness    Cervical / Trunk Assessment Cervical / Trunk Assessment: Normal  Communication   Communication Communication: No apparent difficulties    Cognition Arousal: Alert Behavior During Therapy: WFL for tasks assessed/performed   PT - Cognitive impairments: No apparent impairments                         Following commands: Intact       Cueing Cueing Techniques: Verbal cues     General Comments      Exercises     Assessment/Plan    PT Assessment All further PT needs can be met in the next venue of care  PT Problem List Decreased strength;Decreased activity tolerance;Decreased balance;Decreased mobility       PT Treatment Interventions      PT Goals (Current goals can be found in the Care Plan section)  Acute Rehab PT Goals Patient Stated Goal: return home with family to assist PT Goal Formulation: With patient Time For Goal Achievement: 04/07/24 Potential to Achieve Goals: Good    Frequency       Co-evaluation               AM-PAC PT "6 Clicks" Mobility  Outcome Measure Help needed turning from your back to your side while in a flat bed without using bedrails?: None Help needed moving from lying on your back to sitting on the side of a flat bed without using bedrails?: None Help needed moving to and from a bed to a chair (including a wheelchair)?: None Help needed standing up from a chair using your arms (e.g., wheelchair or bedside chair)?: None Help needed to walk in hospital room?: A Little Help needed climbing 3-5 steps with a railing? : A Little 6 Click Score: 22    End of Session   Activity Tolerance: Patient tolerated treatment well;Patient limited by fatigue Patient left: in chair;with call bell/phone within reach Nurse Communication: Mobility status PT Visit Diagnosis:  Unsteadiness on feet (R26.81);Other abnormalities of gait and mobility (R26.89);Muscle weakness (generalized) (M62.81)    Time: 1610-9604 PT Time Calculation (min) (ACUTE ONLY): 30 min   Charges:   PT Evaluation $PT Eval Moderate Complexity: 1 Mod PT Treatments $Therapeutic Activity: 23-37 mins PT General Charges $$ ACUTE PT VISIT: 1 Visit         10:24 AM, 04/07/24 Walton Guppy, MPT Physical Therapist with [facility] [phone] office [phone] mobile phone

## 2024-04-07 NOTE — ED Notes (Signed)
 Daughter her to pick up pt. Daughter asked "how is pt supposed to bath herself?" Daughter informed PT recommended PT/OT at home. Daughter advised pt may need some assistance with ADL's. Discharge instructions reviewed with pt and daughter. Dressing over head wound intact.

## 2024-04-07 NOTE — ED Notes (Addendum)
 Transition of Care [facility]) - Emergency Department Mini Assessment   Patient Details  Name: Lauren Stokes MRN: [id number] Date of Birth: 1946/05/10  Transition [facility]) CM/SW Contact:    Juanda Noon Phone Number: 04/07/2024, 9:34 AM   Clinical Narrative:  Writer spoke with Jenette Mitchell, patient niece. Jenette Mitchell shared that patient lives with her sister and sister husband. Patient is independent, still drives, and has a brace for her left leg. Niece shared that patient had a prescription for a cane but she never got it. Niece stated that she will be on her way to pick patient up in about an hour.   Niece asked for [facility] to service patient. Niece asked for PT/OT/ Aide. TOC to follow.   [facility] about to accept referral for PT/OT/HH Aide ED Mini Assessment: What brought you to the Emergency Department? : Fall  Barriers to Discharge: Barriers Resolved  Barrier interventions: Home Health setup  Means of departure: Car  Interventions which prevented an admission or readmission: Home Health Consult or Services    Patient Contact and Communications Key Contact 1: Ova Bloomer with: Niece Contact Date: 04/07/24,   Contact time: 0933 Contact Phone Number: [phone] Call outcome: Home Health recommendation  Patient states their goals for this hospitalization and ongoing recovery are:: Return back home CMS Medicare.gov Compare Post Acute Care list provided to:: Patient Represenative (must comment) (Niece- Jenette Mitchell) Choice offered to / list presented to :  (Niece- Jenette Mitchell)  Admission diagnosis:  Fall Patient Active Problem List   Diagnosis Date Noted   Benzodiazepine dependence (HCC) 09/26/2022   Essential hypertension 05/12/2020   Gastroesophageal reflux disease with esophagitis 05/12/2020   Moderate episode of recurrent major depressive disorder (HCC) 05/12/2020   GAD (generalized anxiety disorder) 05/12/2020   Osteoarthritis 08/18/2018   Recurrent major depressive  disorder, in full remission (HCC) 08/18/2018   Allergy 06/08/2014   Anxiety state 06/08/2014   PCP:  Roise Cleaver, MD Pharmacy:   [facility] - [facility], [place] - [address] [place] [address] Phone: [phone] Fax: [phone]

## 2024-04-08 ENCOUNTER — Telehealth: Payer: Self-pay | Admitting: Family Medicine

## 2024-04-08 DIAGNOSIS — Z7951 Long term (current) use of inhaled steroids: Secondary | ICD-10-CM | POA: Diagnosis not present

## 2024-04-08 DIAGNOSIS — F411 Generalized anxiety disorder: Secondary | ICD-10-CM | POA: Diagnosis not present

## 2024-04-08 DIAGNOSIS — F3342 Major depressive disorder, recurrent, in full remission: Secondary | ICD-10-CM | POA: Diagnosis not present

## 2024-04-08 DIAGNOSIS — Z556 Problems related to health literacy: Secondary | ICD-10-CM | POA: Diagnosis not present

## 2024-04-08 DIAGNOSIS — K219 Gastro-esophageal reflux disease without esophagitis: Secondary | ICD-10-CM | POA: Diagnosis not present

## 2024-04-08 DIAGNOSIS — F1320 Sedative, hypnotic or anxiolytic dependence, uncomplicated: Secondary | ICD-10-CM | POA: Diagnosis not present

## 2024-04-08 DIAGNOSIS — Z9181 History of falling: Secondary | ICD-10-CM | POA: Diagnosis not present

## 2024-04-08 DIAGNOSIS — M1990 Unspecified osteoarthritis, unspecified site: Secondary | ICD-10-CM | POA: Diagnosis not present

## 2024-04-08 DIAGNOSIS — S0101XD Laceration without foreign body of scalp, subsequent encounter: Secondary | ICD-10-CM | POA: Diagnosis not present

## 2024-04-08 DIAGNOSIS — I10 Essential (primary) hypertension: Secondary | ICD-10-CM | POA: Diagnosis not present

## 2024-04-08 DIAGNOSIS — Z791 Long term (current) use of non-steroidal anti-inflammatories (NSAID): Secondary | ICD-10-CM | POA: Diagnosis not present

## 2024-04-08 DIAGNOSIS — N83201 Unspecified ovarian cyst, right side: Secondary | ICD-10-CM | POA: Diagnosis not present

## 2024-04-08 NOTE — Telephone Encounter (Signed)
 Verbal orders given. LS

## 2024-04-08 NOTE — Telephone Encounter (Signed)
 Copied from CRM [id number]. Topic: Clinical - Home Health Verbal Orders >> Apr 08, 2024  3:23 PM Danelle Dunning F wrote: Caller/Agency: Arther Bill ( OT)  Callback Number: [phone]  Service Requested: Occupational Therapy; Physical Therapy  Frequency:   Occupational Therapy Frequency:   1 week 5  Physical Therapy Frequency:  1 week 9  Any new concerns about the patient? No

## 2024-04-10 ENCOUNTER — Telehealth: Payer: Self-pay

## 2024-04-10 DIAGNOSIS — Z7951 Long term (current) use of inhaled steroids: Secondary | ICD-10-CM | POA: Diagnosis not present

## 2024-04-10 DIAGNOSIS — N83201 Unspecified ovarian cyst, right side: Secondary | ICD-10-CM | POA: Diagnosis not present

## 2024-04-10 DIAGNOSIS — F1320 Sedative, hypnotic or anxiolytic dependence, uncomplicated: Secondary | ICD-10-CM | POA: Diagnosis not present

## 2024-04-10 DIAGNOSIS — Z791 Long term (current) use of non-steroidal anti-inflammatories (NSAID): Secondary | ICD-10-CM | POA: Diagnosis not present

## 2024-04-10 DIAGNOSIS — Z9181 History of falling: Secondary | ICD-10-CM | POA: Diagnosis not present

## 2024-04-10 DIAGNOSIS — F411 Generalized anxiety disorder: Secondary | ICD-10-CM | POA: Diagnosis not present

## 2024-04-10 DIAGNOSIS — I10 Essential (primary) hypertension: Secondary | ICD-10-CM | POA: Diagnosis not present

## 2024-04-10 DIAGNOSIS — K219 Gastro-esophageal reflux disease without esophagitis: Secondary | ICD-10-CM | POA: Diagnosis not present

## 2024-04-10 DIAGNOSIS — Z556 Problems related to health literacy: Secondary | ICD-10-CM | POA: Diagnosis not present

## 2024-04-10 DIAGNOSIS — S0101XD Laceration without foreign body of scalp, subsequent encounter: Secondary | ICD-10-CM | POA: Diagnosis not present

## 2024-04-10 DIAGNOSIS — F3342 Major depressive disorder, recurrent, in full remission: Secondary | ICD-10-CM | POA: Diagnosis not present

## 2024-04-10 DIAGNOSIS — M1990 Unspecified osteoarthritis, unspecified site: Secondary | ICD-10-CM | POA: Diagnosis not present

## 2024-04-10 NOTE — Telephone Encounter (Signed)
 Pt has been scheduled for 5/13 at 1:50 as requested. LS

## 2024-04-10 NOTE — Telephone Encounter (Signed)
 Copied from CRM [id number]. Topic: Appointments - Appointment Scheduling >> Apr 09, 2024  4:43 PM Leory Rands wrote: Patient representative Latanya Poisson is calling to schedule an appointment for Hospital folllowing for stiches removal the week of Apr 21, 2024. Patient  will be coming with another patient on 04/21/24 at 3:15pm. Patient would like to know can she come at the same time? [phone]

## 2024-04-13 DIAGNOSIS — F1320 Sedative, hypnotic or anxiolytic dependence, uncomplicated: Secondary | ICD-10-CM | POA: Diagnosis not present

## 2024-04-13 DIAGNOSIS — F411 Generalized anxiety disorder: Secondary | ICD-10-CM | POA: Diagnosis not present

## 2024-04-13 DIAGNOSIS — S0101XD Laceration without foreign body of scalp, subsequent encounter: Secondary | ICD-10-CM | POA: Diagnosis not present

## 2024-04-13 DIAGNOSIS — I10 Essential (primary) hypertension: Secondary | ICD-10-CM | POA: Diagnosis not present

## 2024-04-13 DIAGNOSIS — M1990 Unspecified osteoarthritis, unspecified site: Secondary | ICD-10-CM | POA: Diagnosis not present

## 2024-04-13 DIAGNOSIS — Z9181 History of falling: Secondary | ICD-10-CM | POA: Diagnosis not present

## 2024-04-13 DIAGNOSIS — Z556 Problems related to health literacy: Secondary | ICD-10-CM | POA: Diagnosis not present

## 2024-04-13 DIAGNOSIS — K219 Gastro-esophageal reflux disease without esophagitis: Secondary | ICD-10-CM | POA: Diagnosis not present

## 2024-04-13 DIAGNOSIS — Z791 Long term (current) use of non-steroidal anti-inflammatories (NSAID): Secondary | ICD-10-CM | POA: Diagnosis not present

## 2024-04-13 DIAGNOSIS — F3342 Major depressive disorder, recurrent, in full remission: Secondary | ICD-10-CM | POA: Diagnosis not present

## 2024-04-13 DIAGNOSIS — Z7951 Long term (current) use of inhaled steroids: Secondary | ICD-10-CM | POA: Diagnosis not present

## 2024-04-13 DIAGNOSIS — N83201 Unspecified ovarian cyst, right side: Secondary | ICD-10-CM | POA: Diagnosis not present

## 2024-04-16 ENCOUNTER — Ambulatory Visit

## 2024-04-16 DIAGNOSIS — F1320 Sedative, hypnotic or anxiolytic dependence, uncomplicated: Secondary | ICD-10-CM | POA: Diagnosis not present

## 2024-04-16 DIAGNOSIS — Z9181 History of falling: Secondary | ICD-10-CM

## 2024-04-16 DIAGNOSIS — Z556 Problems related to health literacy: Secondary | ICD-10-CM | POA: Diagnosis not present

## 2024-04-16 DIAGNOSIS — Z7951 Long term (current) use of inhaled steroids: Secondary | ICD-10-CM

## 2024-04-16 DIAGNOSIS — F3342 Major depressive disorder, recurrent, in full remission: Secondary | ICD-10-CM

## 2024-04-16 DIAGNOSIS — F411 Generalized anxiety disorder: Secondary | ICD-10-CM | POA: Diagnosis not present

## 2024-04-16 DIAGNOSIS — S0101XD Laceration without foreign body of scalp, subsequent encounter: Secondary | ICD-10-CM | POA: Diagnosis not present

## 2024-04-16 DIAGNOSIS — Z791 Long term (current) use of non-steroidal anti-inflammatories (NSAID): Secondary | ICD-10-CM | POA: Diagnosis not present

## 2024-04-16 DIAGNOSIS — M1990 Unspecified osteoarthritis, unspecified site: Secondary | ICD-10-CM

## 2024-04-16 DIAGNOSIS — N83201 Unspecified ovarian cyst, right side: Secondary | ICD-10-CM

## 2024-04-16 DIAGNOSIS — I10 Essential (primary) hypertension: Secondary | ICD-10-CM

## 2024-04-16 DIAGNOSIS — K219 Gastro-esophageal reflux disease without esophagitis: Secondary | ICD-10-CM | POA: Diagnosis not present

## 2024-04-17 ENCOUNTER — Other Ambulatory Visit: Payer: Self-pay | Admitting: Family Medicine

## 2024-04-17 DIAGNOSIS — F411 Generalized anxiety disorder: Secondary | ICD-10-CM

## 2024-04-17 DIAGNOSIS — F1320 Sedative, hypnotic or anxiolytic dependence, uncomplicated: Secondary | ICD-10-CM

## 2024-04-20 DIAGNOSIS — F411 Generalized anxiety disorder: Secondary | ICD-10-CM | POA: Diagnosis not present

## 2024-04-20 DIAGNOSIS — I10 Essential (primary) hypertension: Secondary | ICD-10-CM | POA: Diagnosis not present

## 2024-04-20 DIAGNOSIS — S0101XD Laceration without foreign body of scalp, subsequent encounter: Secondary | ICD-10-CM | POA: Diagnosis not present

## 2024-04-20 DIAGNOSIS — M1990 Unspecified osteoarthritis, unspecified site: Secondary | ICD-10-CM | POA: Diagnosis not present

## 2024-04-20 DIAGNOSIS — F1320 Sedative, hypnotic or anxiolytic dependence, uncomplicated: Secondary | ICD-10-CM | POA: Diagnosis not present

## 2024-04-20 DIAGNOSIS — N83201 Unspecified ovarian cyst, right side: Secondary | ICD-10-CM | POA: Diagnosis not present

## 2024-04-20 DIAGNOSIS — K219 Gastro-esophageal reflux disease without esophagitis: Secondary | ICD-10-CM | POA: Diagnosis not present

## 2024-04-20 DIAGNOSIS — F3342 Major depressive disorder, recurrent, in full remission: Secondary | ICD-10-CM | POA: Diagnosis not present

## 2024-04-20 DIAGNOSIS — Z556 Problems related to health literacy: Secondary | ICD-10-CM | POA: Diagnosis not present

## 2024-04-20 DIAGNOSIS — Z9181 History of falling: Secondary | ICD-10-CM | POA: Diagnosis not present

## 2024-04-20 DIAGNOSIS — Z7951 Long term (current) use of inhaled steroids: Secondary | ICD-10-CM | POA: Diagnosis not present

## 2024-04-20 DIAGNOSIS — Z791 Long term (current) use of non-steroidal anti-inflammatories (NSAID): Secondary | ICD-10-CM | POA: Diagnosis not present

## 2024-04-21 ENCOUNTER — Encounter: Payer: Self-pay | Admitting: Family Medicine

## 2024-04-21 ENCOUNTER — Ambulatory Visit (INDEPENDENT_AMBULATORY_CARE_PROVIDER_SITE_OTHER): Admitting: Family Medicine

## 2024-04-21 VITALS — BP 124/76 | HR 85 | Temp 97.9°F | Ht 61.0 in | Wt 143.0 lb

## 2024-04-21 DIAGNOSIS — S0101XD Laceration without foreign body of scalp, subsequent encounter: Secondary | ICD-10-CM | POA: Diagnosis not present

## 2024-04-21 DIAGNOSIS — S0101XA Laceration without foreign body of scalp, initial encounter: Secondary | ICD-10-CM

## 2024-04-21 NOTE — Progress Notes (Signed)
 Chief Complaint  Patient presents with   Suture / Staple Removal    Suture removal from back of scalp. No drainage but a little tender.     HPI  Patient presents today for suture removal.  Sutures were placed 8 days ago.  No reported symptoms of infection including pain or swelling.  PMH: Smoking status noted Review of Systems  Objective: BP 124/76   Pulse 85   Temp 97.9 F (36.6 C)   Ht 5\' 1"  (1.549 m)   Wt 143 lb (64.9 kg)   SpO2 99%   BMI 27.02 kg/m  Gen: NAD, alert, cooperative with exam HEENT: NCAT, EOMI, PERRL CV: RRR, good S1/S2, no murmur Resp: CTABL, no wheezes, non-labored Abd: SNTND, BS present, no guarding or organomegaly Ext: No edema, warm Neuro: Alert and oriented, No gross deficits  Laceration of scalp, initial encounter    Sutures removed.  Wound care reviewed.  Follow-up for usual checkup and as needed should symptoms occur.

## 2024-04-23 DIAGNOSIS — Z7951 Long term (current) use of inhaled steroids: Secondary | ICD-10-CM | POA: Diagnosis not present

## 2024-04-23 DIAGNOSIS — Z9181 History of falling: Secondary | ICD-10-CM | POA: Diagnosis not present

## 2024-04-23 DIAGNOSIS — F3342 Major depressive disorder, recurrent, in full remission: Secondary | ICD-10-CM | POA: Diagnosis not present

## 2024-04-23 DIAGNOSIS — I10 Essential (primary) hypertension: Secondary | ICD-10-CM | POA: Diagnosis not present

## 2024-04-23 DIAGNOSIS — Z791 Long term (current) use of non-steroidal anti-inflammatories (NSAID): Secondary | ICD-10-CM | POA: Diagnosis not present

## 2024-04-23 DIAGNOSIS — F1320 Sedative, hypnotic or anxiolytic dependence, uncomplicated: Secondary | ICD-10-CM | POA: Diagnosis not present

## 2024-04-23 DIAGNOSIS — F411 Generalized anxiety disorder: Secondary | ICD-10-CM | POA: Diagnosis not present

## 2024-04-23 DIAGNOSIS — Z556 Problems related to health literacy: Secondary | ICD-10-CM | POA: Diagnosis not present

## 2024-04-23 DIAGNOSIS — N83201 Unspecified ovarian cyst, right side: Secondary | ICD-10-CM | POA: Diagnosis not present

## 2024-04-23 DIAGNOSIS — K219 Gastro-esophageal reflux disease without esophagitis: Secondary | ICD-10-CM | POA: Diagnosis not present

## 2024-04-23 DIAGNOSIS — M1990 Unspecified osteoarthritis, unspecified site: Secondary | ICD-10-CM | POA: Diagnosis not present

## 2024-04-23 DIAGNOSIS — S0101XD Laceration without foreign body of scalp, subsequent encounter: Secondary | ICD-10-CM | POA: Diagnosis not present

## 2024-04-24 DIAGNOSIS — K219 Gastro-esophageal reflux disease without esophagitis: Secondary | ICD-10-CM | POA: Diagnosis not present

## 2024-04-24 DIAGNOSIS — F411 Generalized anxiety disorder: Secondary | ICD-10-CM | POA: Diagnosis not present

## 2024-04-24 DIAGNOSIS — N83201 Unspecified ovarian cyst, right side: Secondary | ICD-10-CM | POA: Diagnosis not present

## 2024-04-24 DIAGNOSIS — F3342 Major depressive disorder, recurrent, in full remission: Secondary | ICD-10-CM | POA: Diagnosis not present

## 2024-04-24 DIAGNOSIS — M1990 Unspecified osteoarthritis, unspecified site: Secondary | ICD-10-CM | POA: Diagnosis not present

## 2024-04-24 DIAGNOSIS — S0101XD Laceration without foreign body of scalp, subsequent encounter: Secondary | ICD-10-CM | POA: Diagnosis not present

## 2024-04-24 DIAGNOSIS — Z7951 Long term (current) use of inhaled steroids: Secondary | ICD-10-CM | POA: Diagnosis not present

## 2024-04-24 DIAGNOSIS — Z9181 History of falling: Secondary | ICD-10-CM | POA: Diagnosis not present

## 2024-04-24 DIAGNOSIS — Z556 Problems related to health literacy: Secondary | ICD-10-CM | POA: Diagnosis not present

## 2024-04-24 DIAGNOSIS — I10 Essential (primary) hypertension: Secondary | ICD-10-CM | POA: Diagnosis not present

## 2024-04-24 DIAGNOSIS — Z791 Long term (current) use of non-steroidal anti-inflammatories (NSAID): Secondary | ICD-10-CM | POA: Diagnosis not present

## 2024-04-24 DIAGNOSIS — F1320 Sedative, hypnotic or anxiolytic dependence, uncomplicated: Secondary | ICD-10-CM | POA: Diagnosis not present

## 2024-04-28 DIAGNOSIS — F3342 Major depressive disorder, recurrent, in full remission: Secondary | ICD-10-CM | POA: Diagnosis not present

## 2024-04-28 DIAGNOSIS — N83201 Unspecified ovarian cyst, right side: Secondary | ICD-10-CM | POA: Diagnosis not present

## 2024-04-28 DIAGNOSIS — I10 Essential (primary) hypertension: Secondary | ICD-10-CM | POA: Diagnosis not present

## 2024-04-28 DIAGNOSIS — Z791 Long term (current) use of non-steroidal anti-inflammatories (NSAID): Secondary | ICD-10-CM | POA: Diagnosis not present

## 2024-04-28 DIAGNOSIS — F1320 Sedative, hypnotic or anxiolytic dependence, uncomplicated: Secondary | ICD-10-CM | POA: Diagnosis not present

## 2024-04-28 DIAGNOSIS — K219 Gastro-esophageal reflux disease without esophagitis: Secondary | ICD-10-CM | POA: Diagnosis not present

## 2024-04-28 DIAGNOSIS — M1990 Unspecified osteoarthritis, unspecified site: Secondary | ICD-10-CM | POA: Diagnosis not present

## 2024-04-28 DIAGNOSIS — F411 Generalized anxiety disorder: Secondary | ICD-10-CM | POA: Diagnosis not present

## 2024-04-28 DIAGNOSIS — S0101XD Laceration without foreign body of scalp, subsequent encounter: Secondary | ICD-10-CM | POA: Diagnosis not present

## 2024-04-28 DIAGNOSIS — Z7951 Long term (current) use of inhaled steroids: Secondary | ICD-10-CM | POA: Diagnosis not present

## 2024-04-28 DIAGNOSIS — Z9181 History of falling: Secondary | ICD-10-CM | POA: Diagnosis not present

## 2024-04-28 DIAGNOSIS — Z556 Problems related to health literacy: Secondary | ICD-10-CM | POA: Diagnosis not present

## 2024-05-01 DIAGNOSIS — F411 Generalized anxiety disorder: Secondary | ICD-10-CM | POA: Diagnosis not present

## 2024-05-01 DIAGNOSIS — F1320 Sedative, hypnotic or anxiolytic dependence, uncomplicated: Secondary | ICD-10-CM | POA: Diagnosis not present

## 2024-05-01 DIAGNOSIS — Z791 Long term (current) use of non-steroidal anti-inflammatories (NSAID): Secondary | ICD-10-CM | POA: Diagnosis not present

## 2024-05-01 DIAGNOSIS — Z9181 History of falling: Secondary | ICD-10-CM | POA: Diagnosis not present

## 2024-05-01 DIAGNOSIS — N83201 Unspecified ovarian cyst, right side: Secondary | ICD-10-CM | POA: Diagnosis not present

## 2024-05-01 DIAGNOSIS — Z556 Problems related to health literacy: Secondary | ICD-10-CM | POA: Diagnosis not present

## 2024-05-01 DIAGNOSIS — K219 Gastro-esophageal reflux disease without esophagitis: Secondary | ICD-10-CM | POA: Diagnosis not present

## 2024-05-01 DIAGNOSIS — M1990 Unspecified osteoarthritis, unspecified site: Secondary | ICD-10-CM | POA: Diagnosis not present

## 2024-05-01 DIAGNOSIS — S0101XD Laceration without foreign body of scalp, subsequent encounter: Secondary | ICD-10-CM | POA: Diagnosis not present

## 2024-05-01 DIAGNOSIS — Z7951 Long term (current) use of inhaled steroids: Secondary | ICD-10-CM | POA: Diagnosis not present

## 2024-05-01 DIAGNOSIS — F3342 Major depressive disorder, recurrent, in full remission: Secondary | ICD-10-CM | POA: Diagnosis not present

## 2024-05-01 DIAGNOSIS — I10 Essential (primary) hypertension: Secondary | ICD-10-CM | POA: Diagnosis not present

## 2024-05-05 DIAGNOSIS — Z9181 History of falling: Secondary | ICD-10-CM | POA: Diagnosis not present

## 2024-05-05 DIAGNOSIS — M1990 Unspecified osteoarthritis, unspecified site: Secondary | ICD-10-CM | POA: Diagnosis not present

## 2024-05-05 DIAGNOSIS — F411 Generalized anxiety disorder: Secondary | ICD-10-CM | POA: Diagnosis not present

## 2024-05-05 DIAGNOSIS — I10 Essential (primary) hypertension: Secondary | ICD-10-CM | POA: Diagnosis not present

## 2024-05-05 DIAGNOSIS — S0101XD Laceration without foreign body of scalp, subsequent encounter: Secondary | ICD-10-CM | POA: Diagnosis not present

## 2024-05-05 DIAGNOSIS — Z791 Long term (current) use of non-steroidal anti-inflammatories (NSAID): Secondary | ICD-10-CM | POA: Diagnosis not present

## 2024-05-05 DIAGNOSIS — F1320 Sedative, hypnotic or anxiolytic dependence, uncomplicated: Secondary | ICD-10-CM | POA: Diagnosis not present

## 2024-05-05 DIAGNOSIS — K219 Gastro-esophageal reflux disease without esophagitis: Secondary | ICD-10-CM | POA: Diagnosis not present

## 2024-05-05 DIAGNOSIS — N83201 Unspecified ovarian cyst, right side: Secondary | ICD-10-CM | POA: Diagnosis not present

## 2024-05-05 DIAGNOSIS — F3342 Major depressive disorder, recurrent, in full remission: Secondary | ICD-10-CM | POA: Diagnosis not present

## 2024-05-05 DIAGNOSIS — Z556 Problems related to health literacy: Secondary | ICD-10-CM | POA: Diagnosis not present

## 2024-05-05 DIAGNOSIS — Z7951 Long term (current) use of inhaled steroids: Secondary | ICD-10-CM | POA: Diagnosis not present

## 2024-05-08 DIAGNOSIS — Z556 Problems related to health literacy: Secondary | ICD-10-CM | POA: Diagnosis not present

## 2024-05-08 DIAGNOSIS — S0101XD Laceration without foreign body of scalp, subsequent encounter: Secondary | ICD-10-CM | POA: Diagnosis not present

## 2024-05-08 DIAGNOSIS — N83201 Unspecified ovarian cyst, right side: Secondary | ICD-10-CM | POA: Diagnosis not present

## 2024-05-08 DIAGNOSIS — F3342 Major depressive disorder, recurrent, in full remission: Secondary | ICD-10-CM | POA: Diagnosis not present

## 2024-05-08 DIAGNOSIS — K219 Gastro-esophageal reflux disease without esophagitis: Secondary | ICD-10-CM | POA: Diagnosis not present

## 2024-05-08 DIAGNOSIS — F1320 Sedative, hypnotic or anxiolytic dependence, uncomplicated: Secondary | ICD-10-CM | POA: Diagnosis not present

## 2024-05-08 DIAGNOSIS — Z9181 History of falling: Secondary | ICD-10-CM | POA: Diagnosis not present

## 2024-05-08 DIAGNOSIS — M1990 Unspecified osteoarthritis, unspecified site: Secondary | ICD-10-CM | POA: Diagnosis not present

## 2024-05-08 DIAGNOSIS — F411 Generalized anxiety disorder: Secondary | ICD-10-CM | POA: Diagnosis not present

## 2024-05-08 DIAGNOSIS — I10 Essential (primary) hypertension: Secondary | ICD-10-CM | POA: Diagnosis not present

## 2024-05-08 DIAGNOSIS — Z791 Long term (current) use of non-steroidal anti-inflammatories (NSAID): Secondary | ICD-10-CM | POA: Diagnosis not present

## 2024-05-08 DIAGNOSIS — Z7951 Long term (current) use of inhaled steroids: Secondary | ICD-10-CM | POA: Diagnosis not present

## 2024-05-19 DIAGNOSIS — Z791 Long term (current) use of non-steroidal anti-inflammatories (NSAID): Secondary | ICD-10-CM | POA: Diagnosis not present

## 2024-05-19 DIAGNOSIS — F411 Generalized anxiety disorder: Secondary | ICD-10-CM | POA: Diagnosis not present

## 2024-05-19 DIAGNOSIS — K219 Gastro-esophageal reflux disease without esophagitis: Secondary | ICD-10-CM | POA: Diagnosis not present

## 2024-05-19 DIAGNOSIS — S0101XD Laceration without foreign body of scalp, subsequent encounter: Secondary | ICD-10-CM | POA: Diagnosis not present

## 2024-05-19 DIAGNOSIS — I10 Essential (primary) hypertension: Secondary | ICD-10-CM | POA: Diagnosis not present

## 2024-05-19 DIAGNOSIS — Z7951 Long term (current) use of inhaled steroids: Secondary | ICD-10-CM | POA: Diagnosis not present

## 2024-05-19 DIAGNOSIS — M1990 Unspecified osteoarthritis, unspecified site: Secondary | ICD-10-CM | POA: Diagnosis not present

## 2024-05-19 DIAGNOSIS — Z9181 History of falling: Secondary | ICD-10-CM | POA: Diagnosis not present

## 2024-05-19 DIAGNOSIS — F1320 Sedative, hypnotic or anxiolytic dependence, uncomplicated: Secondary | ICD-10-CM | POA: Diagnosis not present

## 2024-05-19 DIAGNOSIS — Z556 Problems related to health literacy: Secondary | ICD-10-CM | POA: Diagnosis not present

## 2024-05-19 DIAGNOSIS — F3342 Major depressive disorder, recurrent, in full remission: Secondary | ICD-10-CM | POA: Diagnosis not present

## 2024-05-19 DIAGNOSIS — N83201 Unspecified ovarian cyst, right side: Secondary | ICD-10-CM | POA: Diagnosis not present

## 2024-05-28 ENCOUNTER — Other Ambulatory Visit: Payer: Self-pay | Admitting: Family Medicine

## 2024-05-28 DIAGNOSIS — J3089 Other allergic rhinitis: Secondary | ICD-10-CM

## 2024-06-02 DIAGNOSIS — F1320 Sedative, hypnotic or anxiolytic dependence, uncomplicated: Secondary | ICD-10-CM | POA: Diagnosis not present

## 2024-06-02 DIAGNOSIS — F3342 Major depressive disorder, recurrent, in full remission: Secondary | ICD-10-CM | POA: Diagnosis not present

## 2024-06-02 DIAGNOSIS — Z556 Problems related to health literacy: Secondary | ICD-10-CM | POA: Diagnosis not present

## 2024-06-02 DIAGNOSIS — K219 Gastro-esophageal reflux disease without esophagitis: Secondary | ICD-10-CM | POA: Diagnosis not present

## 2024-06-02 DIAGNOSIS — Z791 Long term (current) use of non-steroidal anti-inflammatories (NSAID): Secondary | ICD-10-CM | POA: Diagnosis not present

## 2024-06-02 DIAGNOSIS — M1990 Unspecified osteoarthritis, unspecified site: Secondary | ICD-10-CM | POA: Diagnosis not present

## 2024-06-02 DIAGNOSIS — S0101XD Laceration without foreign body of scalp, subsequent encounter: Secondary | ICD-10-CM | POA: Diagnosis not present

## 2024-06-02 DIAGNOSIS — Z7951 Long term (current) use of inhaled steroids: Secondary | ICD-10-CM | POA: Diagnosis not present

## 2024-06-02 DIAGNOSIS — I10 Essential (primary) hypertension: Secondary | ICD-10-CM | POA: Diagnosis not present

## 2024-06-02 DIAGNOSIS — F411 Generalized anxiety disorder: Secondary | ICD-10-CM | POA: Diagnosis not present

## 2024-06-02 DIAGNOSIS — N83201 Unspecified ovarian cyst, right side: Secondary | ICD-10-CM | POA: Diagnosis not present

## 2024-06-02 DIAGNOSIS — Z9181 History of falling: Secondary | ICD-10-CM | POA: Diagnosis not present

## 2024-06-04 DIAGNOSIS — S0101XD Laceration without foreign body of scalp, subsequent encounter: Secondary | ICD-10-CM | POA: Diagnosis not present

## 2024-06-04 DIAGNOSIS — N83201 Unspecified ovarian cyst, right side: Secondary | ICD-10-CM | POA: Diagnosis not present

## 2024-06-04 DIAGNOSIS — F411 Generalized anxiety disorder: Secondary | ICD-10-CM | POA: Diagnosis not present

## 2024-06-04 DIAGNOSIS — K219 Gastro-esophageal reflux disease without esophagitis: Secondary | ICD-10-CM | POA: Diagnosis not present

## 2024-06-04 DIAGNOSIS — Z791 Long term (current) use of non-steroidal anti-inflammatories (NSAID): Secondary | ICD-10-CM | POA: Diagnosis not present

## 2024-06-04 DIAGNOSIS — I10 Essential (primary) hypertension: Secondary | ICD-10-CM | POA: Diagnosis not present

## 2024-06-04 DIAGNOSIS — Z7951 Long term (current) use of inhaled steroids: Secondary | ICD-10-CM | POA: Diagnosis not present

## 2024-06-04 DIAGNOSIS — F3342 Major depressive disorder, recurrent, in full remission: Secondary | ICD-10-CM | POA: Diagnosis not present

## 2024-06-04 DIAGNOSIS — M1990 Unspecified osteoarthritis, unspecified site: Secondary | ICD-10-CM | POA: Diagnosis not present

## 2024-06-04 DIAGNOSIS — Z9181 History of falling: Secondary | ICD-10-CM | POA: Diagnosis not present

## 2024-06-04 DIAGNOSIS — F1320 Sedative, hypnotic or anxiolytic dependence, uncomplicated: Secondary | ICD-10-CM | POA: Diagnosis not present

## 2024-06-04 DIAGNOSIS — Z556 Problems related to health literacy: Secondary | ICD-10-CM | POA: Diagnosis not present

## 2024-06-22 DIAGNOSIS — K080 Exfoliation of teeth due to systemic causes: Secondary | ICD-10-CM | POA: Diagnosis not present

## 2024-07-02 DIAGNOSIS — K080 Exfoliation of teeth due to systemic causes: Secondary | ICD-10-CM | POA: Diagnosis not present

## 2024-07-14 ENCOUNTER — Other Ambulatory Visit: Payer: Self-pay | Admitting: Family Medicine

## 2024-07-14 DIAGNOSIS — F1320 Sedative, hypnotic or anxiolytic dependence, uncomplicated: Secondary | ICD-10-CM

## 2024-07-14 DIAGNOSIS — F411 Generalized anxiety disorder: Secondary | ICD-10-CM

## 2024-07-20 ENCOUNTER — Ambulatory Visit: Payer: Self-pay | Admitting: Family Medicine

## 2024-07-20 ENCOUNTER — Ambulatory Visit (INDEPENDENT_AMBULATORY_CARE_PROVIDER_SITE_OTHER): Payer: Medicare Other | Admitting: Family Medicine

## 2024-07-20 ENCOUNTER — Encounter: Payer: Self-pay | Admitting: Family Medicine

## 2024-07-20 VITALS — BP 121/65 | HR 76 | Temp 97.7°F | Ht 61.0 in | Wt 139.4 lb

## 2024-07-20 DIAGNOSIS — Z79899 Other long term (current) drug therapy: Secondary | ICD-10-CM

## 2024-07-20 DIAGNOSIS — I10 Essential (primary) hypertension: Secondary | ICD-10-CM | POA: Diagnosis not present

## 2024-07-20 DIAGNOSIS — R7303 Prediabetes: Secondary | ICD-10-CM

## 2024-07-20 DIAGNOSIS — F1320 Sedative, hypnotic or anxiolytic dependence, uncomplicated: Secondary | ICD-10-CM

## 2024-07-20 DIAGNOSIS — F411 Generalized anxiety disorder: Secondary | ICD-10-CM

## 2024-07-20 DIAGNOSIS — Z13220 Encounter for screening for lipoid disorders: Secondary | ICD-10-CM

## 2024-07-20 LAB — BAYER DCA HB A1C WAIVED: HB A1C (BAYER DCA - WAIVED): 5.7 % — ABNORMAL HIGH (ref 4.8–5.6)

## 2024-07-20 MED ORDER — LORAZEPAM 0.5 MG PO TABS: 0.5000 mg | ORAL_TABLET | Freq: Every day | ORAL | 0 refills | Status: DC

## 2024-07-20 MED ORDER — DULOXETINE HCL 60 MG PO CPEP: 60.0000 mg | ORAL_CAPSULE | Freq: Every day | ORAL | 3 refills | Status: AC

## 2024-07-20 MED ORDER — CETIRIZINE HCL 10 MG PO TABS: 10.0000 mg | ORAL_TABLET | Freq: Every day | ORAL | 3 refills | Status: AC

## 2024-07-20 MED ORDER — OMEPRAZOLE 40 MG PO CPDR: 40.0000 mg | DELAYED_RELEASE_CAPSULE | Freq: Every day | ORAL | 3 refills | Status: AC

## 2024-07-20 MED ORDER — ALENDRONATE SODIUM 70 MG PO TABS: ORAL_TABLET | ORAL | 3 refills | Status: AC

## 2024-07-20 MED ORDER — SODIUM BICARBONATE 650 MG PO TABS: 650.0000 mg | ORAL_TABLET | Freq: Two times a day (BID) | ORAL | 3 refills | Status: AC

## 2024-07-20 NOTE — Progress Notes (Signed)
 Subjective:  Patient ID: Lauren Stokes, female    DOB: 1945/12/24  Age: 78 y.o. MRN: [id number]  CC: Medical Management of Chronic Issues   HPI  Discussed the use of AI scribe software for clinical note transcription with the patient, who gave verbal consent to proceed. Discussed the use of AI scribe software for clinical note transcription with the patient, who gave verbal consent to proceed.  History of Present Illness   Lauren Stokes is a 77 year old female who presents for a follow-up visit regarding falls and medication management.  She experiences difficulty remembering to take her medication, particularly Fosamax  (alendronate ) for osteoporosis, despite its visible placement. She struggles with adherence, which is concerning given her history of frequent falls resulting in head injuries on two occasions. She uses a cane with legs but finds it cumbersome and painful, leading to inconsistent use.  She takes lorazepam  once daily and is advised to handle it carefully to avoid losing pills. She takes diclofenac  twice daily for muscle and joint pain, which does not upset her stomach. She experiences significant pain in her legs and feet, which she feels is due to arthritis. She uses Tylenol  for severe pain.  She takes cetirizine  daily for allergies, having switched from Allegra D due to insurance issues. She is concerned about potential dementia from her medications, particularly omeprazole , but continues to take it for indigestion.  Her recent blood test for diabetes showed an A1c of 5.7; she recalls previously being told she was in the prediabetes range. She lives with her sister and brother-in-law and is involved in their care. No anxiety, restlessness, or being easily annoyed.      History of Present Illness Patient in for follow-up of GERD. Currently asymptomatic taking  PPI daily. There is no chest pain or heartburn. No hematemesis and no melena. No dysphagia or choking. Onset is  remote. Progression is stable. Complicating factors, none.  . presents for  follow-up of hypertension. Patient has no history of headache chest pain or shortness of breath or recent cough. Patient also denies symptoms of TIA such as focal numbness or weakness. Patient denies side effects from medication. States taking it regularly.     07/20/2024    3:46 PM 08/10/2021    2:06 PM 05/16/2021    2:03 PM 11/15/2020    9:35 AM  GAD 7 : Generalized Anxiety Score  Nervous, Anxious, on Edge 0 0 0 0  Control/stop worrying 0 0 0 0  Worry too much - different things 0 0 1 0  Trouble relaxing 0 0 0 0  Restless 0 0 0 0  Easily annoyed or irritable 0 0 1 0  Afraid - awful might happen 0 0 0 0  Total GAD 7 Score 0 0 2 0  Anxiety Difficulty Not difficult at all Not difficult at all Not difficult at all           07/20/2024    3:46 PM 07/20/2024    3:02 PM 02/12/2024   10:37 AM  Depression screen PHQ 2/9  Decreased Interest 0 0 0  Down, Depressed, Hopeless 0 0 0  PHQ - 2 Score 0 0 0  Altered sleeping 0    Tired, decreased energy 0    Change in appetite 0    Feeling bad or failure about yourself  0    Trouble concentrating 0    Moving slowly or fidgety/restless 0    Suicidal thoughts 0    PHQ-9  Score 0    Difficult doing work/chores Not difficult at all      History Elif has a past medical history of Allergy, Collagen vascular disease (HCC), Depression, Essential tremor, GERD (gastroesophageal reflux disease), and Hypertension.   She has a past surgical history that includes Cosmetic surgery and Cholecystectomy.   Her family history includes Aneurysm in her mother; Anxiety disorder in her mother; Cancer in her father; Hearing loss in her sister; Pneumonia in her mother; Ulcers in her mother.She reports that she has never smoked. She has never been exposed to tobacco smoke. She has never used smokeless tobacco. She reports that she does not drink alcohol and does not use  drugs.    ROS Review of Systems  Constitutional: Negative.   HENT: Negative.    Eyes:  Negative for visual disturbance.  Respiratory:  Negative for shortness of breath.   Cardiovascular:  Negative for chest pain.  Gastrointestinal:  Negative for abdominal pain.  Musculoskeletal:  Negative for arthralgias.    Objective:  BP 121/65   Pulse 76   Temp 97.7 F (36.5 C)   Ht 5' 1 (1.549 m)   Wt 139 lb 6.4 oz (63.2 kg)   SpO2 96%   BMI 26.34 kg/m   BP Readings from Last 3 Encounters:  07/20/24 121/65  04/21/24 124/76  04/07/24 (!) 133/47    Wt Readings from Last 3 Encounters:  07/20/24 139 lb 6.4 oz (63.2 kg)  04/21/24 143 lb (64.9 kg)  04/06/24 143 lb 15.4 oz (65.3 kg)     Physical Exam Constitutional:      General: She is not in acute distress.    Appearance: She is well-developed.  HENT:     Head: Normocephalic and atraumatic.  Eyes:     Conjunctiva/sclera: Conjunctivae normal.     Pupils: Pupils are equal, round, and reactive to light.  Neck:     Thyroid: No thyromegaly.  Cardiovascular:     Rate and Rhythm: Normal rate and regular rhythm.     Heart sounds: Normal heart sounds. No murmur heard. Pulmonary:     Effort: Pulmonary effort is normal. No respiratory distress.     Breath sounds: Normal breath sounds. No wheezing or rales.  Abdominal:     General: Bowel sounds are normal. There is no distension.     Palpations: Abdomen is soft.     Tenderness: There is no abdominal tenderness.  Musculoskeletal:        General: Normal range of motion.     Cervical back: Normal range of motion and neck supple.  Lymphadenopathy:     Cervical: No cervical adenopathy.  Skin:    General: Skin is warm and dry.  Neurological:     Mental Status: She is alert and oriented to person, place, and time.  Psychiatric:        Behavior: Behavior normal.        Thought Content: Thought content normal.        Judgment: Judgment normal.      Assessment & Plan:   Essential hypertension -     CBC with Differential/Platelet -     CMP14+EGFR  GAD (generalized anxiety disorder) -     LORazepam ; Take 1 tablet (0.5 mg total) by mouth at bedtime.  Dispense: 90 tablet; Refill: 0  Benzodiazepine dependence (HCC) -     LORazepam ; Take 1 tablet (0.5 mg total) by mouth at bedtime.  Dispense: 90 tablet; Refill: 0  Screening, lipid -  Lipid panel  Prediabetes -     Bayer DCA Hb A1c Waived  Controlled substance agreement signed -     ToxASSURE Select 13 (MW), Urine  Other orders -     DULoxetine  HCl; Take 1 capsule (60 mg total) by mouth daily.  Dispense: 90 capsule; Refill: 3 -     Omeprazole ; Take 1 capsule (40 mg total) by mouth daily.  Dispense: 90 capsule; Refill: 3 -     Sodium Bicarbonate ; Take 1 tablet (650 mg total) by mouth 2 (two) times daily.  Dispense: 180 tablet; Refill: 3 -     Alendronate  Sodium; TAKE 1 TABLET WEEKLY (TAKE WITH 8OZ OF WATER 30 MINUTES BEFORE BREAKFAST)  Dispense: 13 tablet; Refill: 3 -     Cetirizine  HCl; Take 1 tablet (10 mg total) by mouth daily. For allergy symptoms  Dispense: 90 tablet; Refill: 3    Assessment and Plan Assessment & Plan      Follow-up: Return in about 6 months (around 01/20/2025) for Anxiety.  Butler Der, M.D.

## 2024-07-21 LAB — CBC WITH DIFFERENTIAL/PLATELET
Basophils Absolute: 0 x10E3/uL (ref 0.0–0.2)
Basos: 1 %
EOS (ABSOLUTE): 0.2 x10E3/uL (ref 0.0–0.4)
Eos: 2 %
Hematocrit: 38.7 % (ref 34.0–46.6)
Hemoglobin: 12.3 g/dL (ref 11.1–15.9)
Immature Grans (Abs): 0 x10E3/uL (ref 0.0–0.1)
Immature Granulocytes: 0 %
Lymphocytes Absolute: 2.3 x10E3/uL (ref 0.7–3.1)
Lymphs: 28 %
MCH: 29.6 pg (ref 26.6–33.0)
MCHC: 31.8 g/dL (ref 31.5–35.7)
MCV: 93 fL (ref 79–97)
Monocytes Absolute: 0.9 x10E3/uL (ref 0.1–0.9)
Monocytes: 10 %
Neutrophils Absolute: 4.9 x10E3/uL (ref 1.4–7.0)
Neutrophils: 59 %
Platelets: 330 x10E3/uL (ref 150–450)
RBC: 4.16 x10E6/uL (ref 3.77–5.28)
RDW: 12.3 % (ref 11.7–15.4)
WBC: 8.3 x10E3/uL (ref 3.4–10.8)

## 2024-07-21 LAB — CMP14+EGFR
ALT: 26 IU/L (ref 0–32)
AST: 26 IU/L (ref 0–40)
Albumin: 4.2 g/dL (ref 3.8–4.8)
Alkaline Phosphatase: 77 IU/L (ref 44–121)
BUN/Creatinine Ratio: 35 — ABNORMAL HIGH (ref 12–28)
BUN: 28 mg/dL — ABNORMAL HIGH (ref 8–27)
Bilirubin Total: 0.3 mg/dL (ref 0.0–1.2)
CO2: 23 mmol/L (ref 20–29)
Calcium: 9.8 mg/dL (ref 8.7–10.3)
Chloride: 103 mmol/L (ref 96–106)
Creatinine, Ser: 0.8 mg/dL (ref 0.57–1.00)
Globulin, Total: 2.8 g/dL (ref 1.5–4.5)
Glucose: 117 mg/dL — ABNORMAL HIGH (ref 70–99)
Potassium: 4.3 mmol/L (ref 3.5–5.2)
Sodium: 141 mmol/L (ref 134–144)
Total Protein: 7 g/dL (ref 6.0–8.5)
eGFR: 76 mL/min/1.73 (ref >59)

## 2024-07-21 LAB — LIPID PANEL
Chol/HDL Ratio: 2.3 ratio (ref 0.0–4.4)
Cholesterol, Total: 132 mg/dL (ref 100–199)
HDL: 57 mg/dL (ref >39)
LDL Chol Calc (NIH): 59 mg/dL (ref 0–99)
Triglycerides: 84 mg/dL (ref 0–149)
VLDL Cholesterol Cal: 16 mg/dL (ref 5–40)

## 2024-07-22 LAB — TOXASSURE SELECT 13 (MW), URINE

## 2024-07-22 NOTE — Progress Notes (Signed)
 Hello Cristle,  Your lab result is normal and/or stable.Some minor variations that are not significant are commonly marked abnormal, but do not represent any medical problem for you.  Best regards, Butler Der, M.D.

## 2024-07-23 ENCOUNTER — Other Ambulatory Visit: Payer: Self-pay | Admitting: Family Medicine

## 2024-07-23 DIAGNOSIS — F1320 Sedative, hypnotic or anxiolytic dependence, uncomplicated: Secondary | ICD-10-CM

## 2024-07-23 DIAGNOSIS — F411 Generalized anxiety disorder: Secondary | ICD-10-CM

## 2024-10-15 DIAGNOSIS — M79675 Pain in left toe(s): Secondary | ICD-10-CM | POA: Diagnosis not present

## 2024-10-15 DIAGNOSIS — B351 Tinea unguium: Secondary | ICD-10-CM | POA: Diagnosis not present

## 2024-10-15 DIAGNOSIS — M79674 Pain in right toe(s): Secondary | ICD-10-CM | POA: Diagnosis not present

## 2024-10-17 ENCOUNTER — Other Ambulatory Visit: Payer: Self-pay | Admitting: Family Medicine

## 2024-10-17 DIAGNOSIS — F1320 Sedative, hypnotic or anxiolytic dependence, uncomplicated: Secondary | ICD-10-CM

## 2024-10-17 DIAGNOSIS — F411 Generalized anxiety disorder: Secondary | ICD-10-CM

## 2024-12-28 ENCOUNTER — Telehealth: Payer: Self-pay | Admitting: Family Medicine

## 2024-12-28 NOTE — Telephone Encounter (Signed)
 I am okay to take her care over.  Please make sure that she has an appropriate appointment to establish care with me, ideally in a 30-minute slot so that we can go over all of her history prior to her next need for benzodiazepine refill.  She will be expected to complete a controlled substance contract and urine drug screen with me during that visit

## 2024-12-28 NOTE — Telephone Encounter (Signed)
 Patient wants to switch to Dr. KANDICE. Dr. KANDICE has told patient she will take her as a patient.

## 2025-01-20 ENCOUNTER — Ambulatory Visit: Payer: Self-pay | Admitting: Family Medicine

## 2025-02-12 ENCOUNTER — Ambulatory Visit: Payer: Self-pay

## 2025-02-16 ENCOUNTER — Ambulatory Visit
